# Patient Record
Sex: Female | Born: 1995
Health system: Southern US, Community
[De-identification: ages and names within clinical notes are randomized; demographics above are authoritative.]

## PROBLEM LIST (undated history)

## (undated) DIAGNOSIS — R55 Syncope and collapse: Secondary | ICD-10-CM

## (undated) DIAGNOSIS — J45909 Unspecified asthma, uncomplicated: Secondary | ICD-10-CM

## (undated) DIAGNOSIS — F419 Anxiety disorder, unspecified: Secondary | ICD-10-CM

---
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
## 2014-02-23 ENCOUNTER — Observation Stay: Payer: Self-pay | Admitting: Obstetrics and Gynecology

## 2014-02-28 ENCOUNTER — Inpatient Hospital Stay: Payer: Self-pay | Admitting: Obstetrics & Gynecology

## 2014-02-28 LAB — CBC WITH DIFFERENTIAL/PLATELET
BASOS ABS: 0.1 10*3/uL (ref 0.0–0.1)
BASOS PCT: 0.7 %
EOS ABS: 0.2 10*3/uL (ref 0.0–0.7)
EOS PCT: 1.5 %
HCT: 34 % — ABNORMAL LOW (ref 35.0–47.0)
HGB: 11.1 g/dL — ABNORMAL LOW (ref 12.0–16.0)
LYMPHS PCT: 21.6 %
Lymphocyte #: 3.4 10*3/uL (ref 1.0–3.6)
MCH: 26.6 pg (ref 26.0–34.0)
MCHC: 32.8 g/dL (ref 32.0–36.0)
MCV: 81 fL (ref 80–100)
MONO ABS: 1.5 x10 3/mm — AB (ref 0.2–0.9)
Monocyte %: 9.5 %
NEUTROS ABS: 10.6 10*3/uL — AB (ref 1.4–6.5)
Neutrophil %: 66.7 %
Platelet: 251 10*3/uL (ref 150–440)
RBC: 4.17 10*6/uL (ref 3.80–5.20)
RDW: 14.1 % (ref 11.5–14.5)
WBC: 15.8 10*3/uL — AB (ref 3.6–11.0)

## 2014-02-28 LAB — DRUG SCREEN, URINE

## 2014-02-28 LAB — GLUCOSE, RANDOM: Glucose: 111 mg/dL — ABNORMAL HIGH (ref 65–99)

## 2014-03-01 LAB — GC/CHLAMYDIA PROBE AMP

## 2014-03-03 LAB — HEMATOCRIT: HCT: 29.5 % — AB (ref 35.0–47.0)

## 2014-06-19 ENCOUNTER — Ambulatory Visit: Payer: Self-pay | Admitting: Family Medicine

## 2014-11-09 DIAGNOSIS — F3342 Major depressive disorder, recurrent, in full remission: Secondary | ICD-10-CM | POA: Insufficient documentation

## 2014-12-23 NOTE — Op Note (Signed)
PATIENT NAME:  Susan Swanson, Susan Swanson MR#:  [id number] DATE OF BIRTH:  03-03-1996  DATE OF PROCEDURE:  03/02/2014  PREOPERATIVE DIAGNOSIS: Fetal distress, unresponsive to conservative management remote from delivery.   POSTOPERATIVE DIAGNOSES: Fetal distress, unresponsive to conservative management remote from delivery. Extremely short cord.   PROCEDURE PERFORMED:  Stat LUT C-section.   ESTIMATED BLOOD LOSS: 1000 mL.   SURGEON: Elliot Gurneyarrie C. Evone Arseneau, M.D.   FINDINGS: Aforementioned with term liveborn female infant weighing 8 pounds 5 ounces with Apgars of 8 and 9. Cord gases 7.12/74/24.1/-6.7.   DESCRIPTION OF PROCEDURE: I was called to see the patient at the bedside for heart beat in the 70s.  Maneuvers head, chest, Trendelenburg and elevation of the fetal head were performed. No cord was found. The  patient was found to have decelerations every time the fetus descended into the pelvis.  Given that the patient was only 4 cm at the time the decision was made to go back to the OR as the fetus had dipped to the 70s and had been down for 13 minutes. The patient was rushed back to the operating room for a stat C-section and the fetal heart was noted to have been elevated for approximately 3 minutes when stat section was performed.   Timeout was performed. Stat C-section was performed with a Pfannenstiel skin incision carried sharply down to the fascia. The fascia was then cut all the way across with the knife. The muscle belly midline was identified and spread open with the surgeon's fingers and cut to release the attachments with the Metzenbaum scissors. The peritoneum was grasped and sharply entered. Bladder blade was placed, a uterine incision was made and the infant's head was delivered with extension of the fascial incision with the scissors. The infant was delivered. Cord was milked back into the fetus. Cord was clamped and cut and the fetus took a breath and began to cry.   Attention was then turned to  the mother.  Pitocin was started. The placenta was delivered and the very, very short cord was noted when attempting to get the cord gas. The placenta was delivered. The uterus was exteriorized and wrapped in a moist laparotomy sponge. The incision was closed with a running locked chromic and a running imbricating nonlocking chromic; 3-0 chromic was used to tack back the bladder as it had come off the lower uterine segment. Good hemostasis was identified. The belly was cleared of clots. The uterus was placed back into the abdomen. The gutters were cleared.   The muscle bellies were approximated with Vicryl suture. The On-Q trocar pump was placed. The catheters were wrapped around the muscle belly. The fascia was closed with a running Vicryl suture.  Plain gut suture was used to approximate the subcutaneous fat. Skin clips were placed. Dermabond was placed at the On-Q trocars. A tray was called for and the belly was cleared of all instruments, laps and sponges.  Clear urine was noted in the Foley bag.  Then 4 x 4s and Tegaderms were placed on the trocar site, 4 x 4s and ABDs were placed on the lower incision. The patient's uterus was found to be firm and the patient was taken to recovery after having tolerated the procedure well.    ____________________________ Elliot Gurneyarrie C. Dezmond Downie, MD cck:lt D: 03/02/2014 01:20:33 ET T: 03/02/2014 10:57:27 ET JOB#: [id number]  cc: Elliot Gurneyarrie C. Aviv Lengacher, MD, <Dictator> Elliot GurneyARRIE C Kalifa Cadden MD ELECTRONICALLY SIGNED 03/07/2014 16:05

## 2015-01-09 NOTE — H&P (Signed)
L&D Evaluation:  History Expanded:  HPI 19 yo G1 at 29 weeks w suprapubic pain today.  Had sopme spotting for 2 days. Denies trauma, sex, other.  [facility].  h/o chlamydia and smioking and drug use this pregnancy.   Patient's Medical History No Chronic Illness   Patient's Surgical History none   Medications Pre Natal Vitamins   Allergies Tramadol   Social History none   Family History Non-Contributory   ROS:  ROS All systems were reviewed.  HEENT, CNS, GI, GU, Respiratory, CV, Renal and Musculoskeletal systems were found to be normal.   Exam:  Vital Signs stable   General no apparent distress   Mental Status clear   Abdomen gravid, non-tender   Estimated Fetal Weight Average for gestational age   Back no CVAT   Edema no edema   Pelvic no external lesions, cervix closed and thick   Mebranes Intact   FHT normal rate with no decels   Ucx absent   Skin dry   Impression:  Impression UTI vs vaginitis vs Preterm Labor   Plan:  Plan UA, EFM/NST, monitor contractions and for cervical change   Comments fetal fibronectin GC/Chl   Electronic Signatures: Letitia LibraHarris, Terrica Duecker Paul (MD)  (Signed 14-Apr-15 23:58)  Authored: L&D Evaluation   Last Updated: 14-Apr-15 23:58 by Letitia LibraHarris, Aron Needles Paul (MD)

## 2015-01-09 NOTE — H&P (Signed)
L&D Evaluation:  History:  HPI 19 yo G1 at 40 weeks 5 days gestation by 6042w6d US derived EDC of 02/23/2014 presenting for IOL.  +FM, no LOF, no ctx  [facility].  h/o chlamydia and 1/2 ppd smoking and drug use this pregnancy. Bpos/RI/VNI has  asthma, PF has been good. her gbs is unavailable at this time, tdap in may 2015 at [facility]   Presents with contractions   Patient's Medical History Asthma   Patient's Surgical History none   Medications Pre Natal Vitamins   Allergies Tramadol   Social History none   Family History Non-Contributory   ROS:  ROS All systems were reviewed.  HEENT, CNS, GI, GU, Respiratory, CV, Renal and Musculoskeletal systems were found to be normal.   Exam:  Vital Signs stable  113/62   Urine Protein not completed   General no apparent distress   Mental Status clear   Chest clear   Abdomen gravid, non-tender   Estimated Fetal Weight Average for gestational age   Back no CVAT   Edema no edema   Pelvic no external lesions, 1/75/-3   Mebranes Intact   FHT normal rate with no decels, 130, moderate, postive accels, no decels - cat 1   Ucx irregular   Skin dry   Impression:  Impression IOL 3915w5d postdates   Plan:  Plan EFM/NST   Comments 1)  IOL - cervidil tonight  2) Fetus - category I tracing     - 63lbs weight gain this pregnancy     - AFI today 15.48cm     - early 1-hr was 61 lapse of care between 20-28 weeks was supposed to have 1-hr drawn on 4/7 no results in system     - no growth scan in the 3rd trimester  3) B pos / ABSC neg / RI / VZNI / HBsAg neg / HIV neg / RPR NR / + chlamydia with negative test of cure / early 1-hr 61 GBS (As well as repeat GC & CT) negative on 01/30/14     - varivax on discharge     - GC & CT cultures on admission  4) Asthma  5) TDAP received [facility] 12/2013  6) Substance use - history of cocaine and canabis use will obtain UDS with admission  7)  Contraception - nexplanon  7) Disposition pending delivery   Follow Up Appointment need to schedule   Electronic Signatures: Lorrene ReidStaebler, Andreas M (MD)  (Signed 30-Jun-15 22:19)  Authored: L&D Evaluation   Last Updated: 30-Jun-15 22:19 by Lorrene ReidStaebler, Andreas M (MD)

## 2015-01-09 NOTE — H&P (Signed)
L&D Evaluation:  History Expanded:  HPI 19 yo G1 at 40 weeks with lots of pain today. she hasnt counted how long or how many ctx she has,  Had some spotting for a few days. Denies trauma, sex, other.  [facility].  h/o chlamydia and 1/2 ppd smoking and drug use this pregnancy. Bpos/RI/VNI has  asthma, PF has been good. her gbs is unavailable at this time, tdap in may 2015 at [facility]   Gravida 1   Term 0   PreTerm 0   Abortion 0   Living 0   Maternal HIV Negative   Maternal Syphilis Ab Nonreactive   Maternal Varicella Non-Immune   Rubella Results (Maternal) immune   Maternal T-Dap Immune   [facility]   Presents with contractions   Patient's Medical History No Chronic Illness   Patient's Surgical History none   Medications Pre Natal Vitamins   Allergies Tramadol   Social History none   Family History Non-Contributory   Current Prenatal Course Notable For asthma   ROS:  ROS All systems were reviewed.  HEENT, CNS, GI, GU, Respiratory, CV, Renal and Musculoskeletal systems were found to be normal.   Exam:  Vital Signs stable   Urine Protein not completed   General no apparent distress   Mental Status clear   Chest clear   Abdomen gravid, non-tender   Estimated Fetal Weight Average for gestational age   Back no CVAT   Edema no edema   Pelvic no external lesions, 1-2/50   Mebranes Intact   FHT normal rate with no decels, cat 1   Fetal Heart Rate 140   Ucx irregular   Skin dry   Impression:  Impression early labor   Plan:  Plan EFM/NST, monitor contractions and for cervical change   Follow Up Appointment need to schedule   Electronic Signatures: Adria DevonKlett, Carrie (MD)  (Signed 25-Jun-15 18:44)  Authored: L&D Evaluation   Last Updated: 25-Jun-15 18:44 by Adria DevonKlett, Carrie (MD)

## 2015-06-26 ENCOUNTER — Other Ambulatory Visit: Payer: Self-pay

## 2015-06-26 ENCOUNTER — Emergency Department: Payer: Self-pay

## 2015-06-26 ENCOUNTER — Emergency Department
Admission: EM | Admit: 2015-06-26 | Discharge: 2015-06-26 | Disposition: A | Discharge status: Home / Self Care | Payer: Self-pay | Attending: Student | Admitting: Student

## 2015-06-26 DIAGNOSIS — Z79899 Other long term (current) drug therapy: Secondary | ICD-10-CM | POA: Insufficient documentation

## 2015-06-26 DIAGNOSIS — F329 Major depressive disorder, single episode, unspecified: Secondary | ICD-10-CM | POA: Insufficient documentation

## 2015-06-26 DIAGNOSIS — Z3202 Encounter for pregnancy test, result negative: Secondary | ICD-10-CM | POA: Insufficient documentation

## 2015-06-26 DIAGNOSIS — R55 Syncope and collapse: Secondary | ICD-10-CM | POA: Insufficient documentation

## 2015-06-26 HISTORY — DX: Syncope and collapse: R55

## 2015-06-26 HISTORY — DX: Anxiety disorder, unspecified: F41.9

## 2015-06-26 HISTORY — DX: Unspecified asthma, uncomplicated: J45.909

## 2015-06-26 LAB — CBC WITH DIFFERENTIAL/PLATELET
Basophils Absolute: 0 10*3/uL (ref 0–0.1)
Basophils Relative: 1 %
EOS PCT: 1 %
Eosinophils Absolute: 0.1 10*3/uL (ref 0–0.7)
HCT: 43.6 % (ref 35.0–47.0)
HEMOGLOBIN: 14.5 g/dL (ref 12.0–16.0)
LYMPHS ABS: 2.1 10*3/uL (ref 1.0–3.6)
LYMPHS PCT: 22 %
MCH: 27.2 pg (ref 26.0–34.0)
MCHC: 33.2 g/dL (ref 32.0–36.0)
MCV: 81.8 fL (ref 80.0–100.0)
MONOS PCT: 7 %
Monocytes Absolute: 0.7 10*3/uL (ref 0.2–0.9)
NEUTROS PCT: 69 %
Neutro Abs: 6.6 10*3/uL — ABNORMAL HIGH (ref 1.4–6.5)
Platelets: 230 10*3/uL (ref 150–440)
RBC: 5.33 MIL/uL — AB (ref 3.80–5.20)
RDW: 14.2 % (ref 11.5–14.5)
WBC: 9.5 10*3/uL (ref 3.6–11.0)

## 2015-06-26 LAB — TROPONIN I: Troponin I: <0.03 ng/mL (ref <0.031)

## 2015-06-26 LAB — BASIC METABOLIC PANEL
Anion gap: 11 (ref 5–15)
BUN: 6 mg/dL (ref 6–20)
CHLORIDE: 104 mmol/L (ref 101–111)
CO2: 25 mmol/L (ref 22–32)
Calcium: 9.4 mg/dL (ref 8.9–10.3)
Creatinine, Ser: 0.88 mg/dL (ref 0.44–1.00)
GFR calc Af Amer: >60 mL/min (ref >60)
GFR calc non Af Amer: >60 mL/min (ref >60)
GLUCOSE: 96 mg/dL (ref 65–99)
POTASSIUM: 3.8 mmol/L (ref 3.5–5.1)
Sodium: 140 mmol/L (ref 135–145)

## 2015-06-26 LAB — PREGNANCY, URINE: Preg Test, Ur: NEGATIVE

## 2015-06-26 MED ORDER — SODIUM CHLORIDE 0.9 % IV BOLUS (SEPSIS)
1000.0000 mL | Freq: Once | INTRAVENOUS | Status: AC
  Administered 2015-06-26: 1000 mL via INTRAVENOUS

## 2015-06-26 NOTE — ED Notes (Signed)
MD at bedside. 

## 2015-06-26 NOTE — ED Notes (Signed)
Pt presents to ED with syncope at work. States it happened maybe around 7. Is not able to tell this RN if she hit her head or not, but states that her head does not hurt at this moment.

## 2015-06-26 NOTE — ED Notes (Addendum)
Per EMS, pt is taking unprescribed Adderall and marijuana. Pt does not want family to know this and has stated that she wants family to leave the room when the doctor comes in to talk to her.

## 2015-06-26 NOTE — ED Provider Notes (Signed)
[facility] Emergency Department Provider Note  ____________________________________________  Time seen: Approximately 8:09 AM  I have reviewed the triage vital signs and the nursing notes.   HISTORY  Chief Complaint Loss of Consciousness    HPI Susan Swanson is a 19 y.o. female with history of anxiety, depression, multiple prior syncopal episodes previously evaluated by cardiology who presents for evaluation of syncope. She reports that she was at work this morning, working as a Conservation officer, nature. She suddenly felt lightheaded, nauseated as if the room was spinning and she fainted and fell. She reports this has happened to her numerous times in the past. Denies Hitting her head, she denies any pain complaints as a result of the fall. She denies any chest pain or difficulty breathing. She reports she has had 5 episodes of nonbloody nonbilious emesis this morning but no fever, no diarrhea. She has not been able to eat or drink secondary to nausea over the past 5 days because she is "stressed". She reports that she has a "a lot going on.... I just broke up with my boyfriend". She endorses depression but denies any suicidal ideation, homicidal ideation or audiovisual hallucinations. Currently she feels well, she is asymptomatic. There are no modifying factors. She reports that she is taking Adderall that is not prescribed to her as well as smoking marijuana because it helps her anxiety.   Past Medical History  Diagnosis Date  . Syncope   . Anxiety   . Asthma     There are no active problems to display for this patient.   Past Surgical History  Procedure Laterality Date  . Cesarean section      Current Outpatient Rx  Name  Route  Sig  Dispense  Refill  . etonogestrel (NEXPLANON) 68 MG IMPL implant   Intradermal   Inject 68 mg into the skin once.         . gabapentin (NEURONTIN) 300 MG capsule   Oral   Take 300 mg by mouth 6 (six) times daily.            Allergies Review of patient's allergies indicates no known allergies.  No family history on file.  Social History Social History  Substance Use Topics  . Smoking status: None  . Smokeless tobacco: None  . Alcohol Use: None    Review of Systems Constitutional: No fever/chills Eyes: No visual changes. ENT: No sore throat. Cardiovascular: Denies chest pain. Respiratory: Denies shortness of breath. Gastrointestinal: No abdominal pain.  No nausea, no vomiting.  No diarrhea.  No constipation. Genitourinary: Negative for dysuria. Musculoskeletal: Negative for back pain. Skin: Negative for rash. Neurological: Negative for headaches, focal weakness or numbness.  10-point ROS otherwise negative.  ____________________________________________   PHYSICAL EXAM:  VITAL SIGNS: ED Triage Vitals  Enc Vitals Group     BP 06/26/15 0806 112/69 mmHg     Pulse Rate 06/26/15 0806 83     Resp 06/26/15 0806 14     Temp 06/26/15 0806 98 F (36.7 C)     Temp Source 06/26/15 0806 Oral     SpO2 06/26/15 0804 99 %     Weight 06/26/15 0806 250 lb (113.399 kg)     Height 06/26/15 0806  (1.651 m)     Head Cir --      Peak Flow --      Pain Score 06/26/15 0808 0     Pain Loc --      Pain Edu? --  Excl. in GC? --     Constitutional: Alert and oriented. Well appearing and in no acute distress. Eyes: Conjunctivae are normal. PERRL. EOMI. Head: Atraumatic. Nose: No congestion/rhinnorhea. Mouth/Throat: Mucous membranes are moist.  Oropharynx non-erythematous. Neck: No stridor. No midline C-spine tenderness to palpation. Cardiovascular: Normal rate, regular rhythm. Grossly normal heart sounds.  Good peripheral circulation. Respiratory: Normal respiratory effort.  No retractions. Lungs CTAB. Gastrointestinal: Soft and nontender. No distention. No CVA tenderness. Genitourinary: deferred Musculoskeletal: No lower extremity tenderness nor edema.  No joint effusions. Neurologic:   Normal speech and language. No gross focal neurologic deficits are appreciated. No gait instability. Skin:  Skin is warm, dry and intact. No rash noted. Psychiatric: Mood and affect are normal. Speech and behavior are normal.  ____________________________________________   LABS (all labs ordered are listed, but only abnormal results are displayed)  Labs Reviewed  PREGNANCY, URINE  CBC WITH DIFFERENTIAL/PLATELET  BASIC METABOLIC PANEL  TROPONIN I  URINE DRUG SCREEN, QUALITATIVE ([facility] ONLY)   ____________________________________________  EKG  ED ECG REPORT I, Gayla DossGayle, Aislynn Cifelli A, the attending physician, personally viewed and interpreted this ECG.   Date: 06/26/2015  EKG Time: 08:26  Rate: 85  Rhythm:normal sinus rhythm with sinus arrhythmia, normal EKG  Axis: normal  Intervals:none  ST&T Change: none  ____________________________________________  RADIOLOGY  CXR IMPRESSION: No active disease. ____________________________________________   PROCEDURES  Procedure(s) performed: None  Critical Care performed: No  ____________________________________________   INITIAL IMPRESSION / ASSESSMENT AND PLAN / ED COURSE  Pertinent labs & imaging results that were available during my care of the patient were reviewed by me and considered in my medical decision making (see chart for details).  Susan Swanson is a 19 y.o. female with history of anxiety, depression, multiple prior syncopal episodes previously evaluated by cardiology who presents for evaluation of syncope. On exam, she is very well-appearing and in no acute distress. Vital signs stable, she is afebrile. She is currently asymptomatic. She has benign/atraumatic  examination as well as an intact neurological examination. I suspect likely vasovagal syncope, possibly secondary to dehydration in setting of vomiting today as well as poor oral intake over the past several days. She does have positive orthostatics. She has no  abdominal tenderness, no fevers, I doubt any acute life-threatening intra-abdominal process. Doubt cardiogenic syncope given no chest pain, no difficulty breathing, reassuring EKG and history of prior syncopal events without any diagnosed arrhythmia. Not consistent with acute neurogenic syncope. Went for screening labs, brief observation in the emergency department. We'll give IV fluids. If she can taste to be well appearing, anticipate discharge with close PCP if follow-up.  ----------------------------------------- 10:17 AM on 06/26/2015 ----------------------------------------- Labs reviewed. Normal CBC, BMP, negative troponin, negative pregnancy test. Chest x-ray clear. At this time, the patient reports that she feels well and she is requesting discharge. She is in her room attempting to remove the cardiac leads on her chest, refusing any more IV fluids. Her vital signs are stable, she is suitable for discharge. We discussed need for oral hydration, close PCP follow-up, return precautions, and she is comfortable with the discharge plan.  ____________________________________________   FINAL CLINICAL IMPRESSION(S) / ED DIAGNOSES  Final diagnoses:  Syncope, unspecified syncope type      Gayla DossEryka A Remmy Crass, MD 06/26/15 1018

## 2016-01-15 ENCOUNTER — Emergency Department: Payer: Self-pay

## 2016-01-15 ENCOUNTER — Encounter: Payer: Self-pay | Admitting: Emergency Medicine

## 2016-01-15 ENCOUNTER — Emergency Department
Admission: EM | Admit: 2016-01-15 | Discharge: 2016-01-15 | Disposition: A | Discharge status: Home / Self Care | Payer: Self-pay | Attending: Student | Admitting: Student

## 2016-01-15 DIAGNOSIS — M549 Dorsalgia, unspecified: Secondary | ICD-10-CM | POA: Insufficient documentation

## 2016-01-15 DIAGNOSIS — R55 Syncope and collapse: Secondary | ICD-10-CM | POA: Insufficient documentation

## 2016-01-15 DIAGNOSIS — J45909 Unspecified asthma, uncomplicated: Secondary | ICD-10-CM | POA: Insufficient documentation

## 2016-01-15 DIAGNOSIS — F329 Major depressive disorder, single episode, unspecified: Secondary | ICD-10-CM | POA: Insufficient documentation

## 2016-01-15 DIAGNOSIS — Z79899 Other long term (current) drug therapy: Secondary | ICD-10-CM | POA: Insufficient documentation

## 2016-01-15 DIAGNOSIS — F17210 Nicotine dependence, cigarettes, uncomplicated: Secondary | ICD-10-CM | POA: Insufficient documentation

## 2016-01-15 LAB — URINALYSIS COMPLETE WITH MICROSCOPIC (ARMC ONLY)
Bilirubin Urine: NEGATIVE
Glucose, UA: NEGATIVE mg/dL
KETONES UR: NEGATIVE mg/dL
LEUKOCYTES UA: NEGATIVE
Nitrite: NEGATIVE
PH: 7 (ref 5.0–8.0)
PROTEIN: NEGATIVE mg/dL
RBC / HPF: NONE SEEN RBC/hpf (ref 0–5)
SPECIFIC GRAVITY, URINE: 1.001 — AB (ref 1.005–1.030)
WBC UA: NONE SEEN WBC/hpf (ref 0–5)

## 2016-01-15 LAB — BASIC METABOLIC PANEL
ANION GAP: 7 (ref 5–15)
BUN: 9 mg/dL (ref 6–20)
CHLORIDE: 104 mmol/L (ref 101–111)
CO2: 25 mmol/L (ref 22–32)
CREATININE: 0.74 mg/dL (ref 0.44–1.00)
Calcium: 8.9 mg/dL (ref 8.9–10.3)
GFR calc non Af Amer: >60 mL/min (ref >60)
Glucose, Bld: 91 mg/dL (ref 65–99)
Potassium: 3 mmol/L — ABNORMAL LOW (ref 3.5–5.1)
SODIUM: 136 mmol/L (ref 135–145)

## 2016-01-15 LAB — CBC WITH DIFFERENTIAL/PLATELET
BASOS ABS: 0.1 10*3/uL (ref 0–0.1)
EOS ABS: 0.3 10*3/uL (ref 0–0.7)
Eosinophils Relative: 2 %
HCT: 42.6 % (ref 35.0–47.0)
HEMOGLOBIN: 14.2 g/dL (ref 12.0–16.0)
LYMPHS ABS: 3 10*3/uL (ref 1.0–3.6)
MCH: 27.7 pg (ref 26.0–34.0)
MCHC: 33.2 g/dL (ref 32.0–36.0)
MCV: 83.4 fL (ref 80.0–100.0)
Monocytes Absolute: 0.7 10*3/uL (ref 0.2–0.9)
Monocytes Relative: 5 %
Neutro Abs: 8.4 10*3/uL — ABNORMAL HIGH (ref 1.4–6.5)
Neutrophils Relative %: 68 %
Platelets: 221 10*3/uL (ref 150–440)
RBC: 5.11 MIL/uL (ref 3.80–5.20)
RDW: 13.7 % (ref 11.5–14.5)
WBC: 12.4 10*3/uL — AB (ref 3.6–11.0)

## 2016-01-15 LAB — FIBRIN DERIVATIVES D-DIMER (ARMC ONLY): FIBRIN DERIVATIVES D-DIMER (ARMC): 409 (ref 0–499)

## 2016-01-15 LAB — TROPONIN I

## 2016-01-15 LAB — POCT PREGNANCY, URINE: PREG TEST UR: NEGATIVE

## 2016-01-15 MED ORDER — SODIUM CHLORIDE 0.9 % IV BOLUS (SEPSIS)
1000.0000 mL | Freq: Once | INTRAVENOUS | Status: AC
  Administered 2016-01-15: 1000 mL via INTRAVENOUS

## 2016-01-15 MED ORDER — KETOROLAC TROMETHAMINE 30 MG/ML IJ SOLN
15.0000 mg | Freq: Once | INTRAMUSCULAR | Status: AC
  Administered 2016-01-15: 15 mg via INTRAVENOUS
  Filled 2016-01-15: qty 1

## 2016-01-15 MED ORDER — POTASSIUM CHLORIDE CRYS ER 20 MEQ PO TBCR
40.0000 meq | EXTENDED_RELEASE_TABLET | Freq: Once | ORAL | Status: AC
  Administered 2016-01-15: 40 meq via ORAL
  Filled 2016-01-15: qty 2

## 2016-01-15 MED ORDER — IBUPROFEN 600 MG PO TABS: 600.0000 mg | ORAL_TABLET | Freq: Four times a day (QID) | ORAL | Status: DC | PRN

## 2016-01-15 NOTE — ED Notes (Signed)
Pt here with c/o right flank pain and lower abd pain that "feels like I am in labor," had a pre-syncopal episode at work today, was able to sit down and never had a complete syncopal episode. Drove herself to the ED, when speaking with registration, pt had a syncopal episode. Arrived via stretcher with collar intact. C/o pain in the side of her neck, states she has waves of right flank pain and lower abd pain. Appears in NAD. VSS.

## 2016-01-15 NOTE — ED Provider Notes (Addendum)
[facility] Emergency Department Provider Note   ____________________________________________  Time seen: Approximately 3:45 PM  I have reviewed the triage vital signs and the nursing notes.   HISTORY  Chief Complaint Loss of Consciousness; Back Pain; and Flank Pain    HPI Susan Swanson is a 20 y.o. female  with history of anxiety, depression, multiple prior syncopal episodes previously evaluated by cardiology who presents for evaluation of syncope and back pain today, gradual onset, moderate, syncope preceded by a/exacerbated by pain. Patient reports that while she is at work she began having back spasms in her back bilaterally. She began feeling lightheaded as if she was stable but she was able to lower herself down to the ground. When she arrived to the emergency department, she was being evaluated in triage and had another bout of severe back pain at which time she did faint, she hit her head but she denies any severe headache or neck pain. She has had multiple prior episodes of syncope, stating at minimum 20-30 episodes, with previously unrevealing workup. She reports this is similar to her prior syncopal episodes. She denies any back trauma prior to today, no bowel or bladder incontinence, no history of malignancy, no history of IV drug use, no fevers, no saddle anesthesia. She reports she has been having some mild chest pain over the past couple of days but she believes that that may be secondary to the fact that she is "getting a cold", she has had cough and runny nose. She denies any personal history of PE or DVT but does report that she has a strong family history of "blood clots".   Past Medical History  Diagnosis Date  . Syncope   . Anxiety   . Asthma     There are no active problems to display for this patient.   Past Surgical History  Procedure Laterality Date  . Cesarean section      Current Outpatient Rx  Name  Route  Sig  Dispense   Refill  . etonogestrel (NEXPLANON) 68 MG IMPL implant   Intradermal   Inject 68 mg into the skin once.         . gabapentin (NEURONTIN) 300 MG capsule   Oral   Take 300 mg by mouth 6 (six) times daily.         Marland Kitchen. ibuprofen (ADVIL,MOTRIN) 600 MG tablet   Oral   Take 1 tablet (600 mg total) by mouth every 6 (six) hours as needed for moderate pain.   15 tablet   0     Allergies Review of patient's allergies indicates no known allergies.  No family history on file.  Social History Social History  Substance Use Topics  . Smoking status: Current Every Day Smoker -- 0.50 packs/day    Types: Cigarettes  . Smokeless tobacco: None  . Alcohol Use: None    Review of Systems Constitutional: No fever/chills Eyes: No visual changes. ENT: No sore throat. Cardiovascular: Denies chest pain. Respiratory: Denies shortness of breath. Gastrointestinal: No abdominal pain.  No nausea, no vomiting.  No diarrhea.  No constipation. Genitourinary: Negative for dysuria. Musculoskeletal: Positive for back pain. Skin: Negative for rash. Neurological: Negative for headaches, focal weakness or numbness.  10-point ROS otherwise negative.  ____________________________________________   PHYSICAL EXAM:   Filed Vitals:   01/15/16 1644 01/15/16 1648 01/15/16 1730  BP: 113/67  108/55  Pulse: 87  70  Temp: 98.2 F (36.8 C)    TempSrc: Oral  Resp: 18  16  Height:  5\' 4"  (1.626 m)   Weight:  238 lb 1.6 oz (108 kg)   SpO2: 100%  98%    Constitutional: Alert and oriented. Nontoxic- appearing and in no acute distress. Eyes: Conjunctivae are normal. PERRL. EOMI. Head: Atraumatic. Nose: No congestion/rhinnorhea. Mouth/Throat: Mucous membranes are moist.  Oropharynx non-erythematous. Neck: No stridor. Supple without meningismus. No midline C-spine tenderness to palpation, no bony step-off or deformity. Cardiovascular: Normal rate, regular rhythm. Grossly normal heart sounds.  Good  peripheral circulation. Respiratory: Normal respiratory effort.  No retractions. Lungs CTAB. Gastrointestinal: Soft and nontender. No distention. No CVA tenderness. Genitourinary: deferred Musculoskeletal: No lower extremity tenderness nor edema.  No joint effusions. Mild midline tender to palpation throughout the midline of the lumbar spine as well as in the paravertebral muscles of the lumbar spine bilaterally. Neurologic:  Normal speech and language. No gross focal neurologic deficits are appreciated. 5 out of 5 strength bilateral upper and lower extremities, sensation intact to light touch throughout. Normal strength of dorsiflexion and bilateral hallux toes. Normal ambulation. Skin:  Skin is warm, dry and intact. No rash noted. Psychiatric: Mood and affect are normal. Speech and behavior are normal.  ____________________________________________   LABS (all labs ordered are listed, but only abnormal results are displayed)  Labs Reviewed  CBC WITH DIFFERENTIAL/PLATELET - Abnormal; Notable for the following:    WBC 12.4 (*)    Neutro Abs 8.4 (*)    All other components within normal limits  BASIC METABOLIC PANEL - Abnormal; Notable for the following:    Potassium 3.0 (*)    All other components within normal limits  URINALYSIS COMPLETEWITH MICROSCOPIC ([facility] ONLY) - Abnormal; Notable for the following:    Color, Urine STRAW (*)    APPearance HAZY (*)    Specific Gravity, Urine 1.001 (*)    Hgb urine dipstick 1+ (*)    Bacteria, UA FEW (*)    Squamous Epithelial / LPF 0-5 (*)    All other components within normal limits  TROPONIN I  FIBRIN DERIVATIVES D-DIMER ([facility] ONLY)  POC URINE PREG, ED  POCT PREGNANCY, URINE   ____________________________________________  EKG  ED ECG REPORT I, Gayla Doss, the attending physician, personally viewed and interpreted this ECG.   Date: 01/15/2016  EKG Time: 15:54  Rate: 79  Rhythm: normal EKG, normal sinus rhythm  Axis: normal   Intervals:none  ST&T Change: No acute ST elevation.  ____________________________________________  RADIOLOGY  CXR IMPRESSION: No active cardiopulmonary disease.   Xray lumbar spine IMPRESSION: No acute abnormality noted. ____________________________________________   PROCEDURES  Procedure(s) performed: None  Critical Care performed: No  ____________________________________________   INITIAL IMPRESSION / ASSESSMENT AND PLAN / ED COURSE  Pertinent labs & imaging results that were available during my care of the patient were reviewed by me and considered in my medical decision making (see chart for details).  Susan Swanson is a 19 y.o. female  with history of anxiety, depression, multiple prior syncopal episodes previously evaluated by cardiology who presents for evaluation of syncope and back pain today. On exam, she is nontoxic appearing and in no acute distress. Head and neck are atraumatic. She does have some midline tenderness in the lumbar paravertebral muscles of the back as well as the midline. She has an intact neurological examination. Recurrent syncope, likely orthostatic/vagally mediated in the setting of pain. EKG reassuring, intact neurological exam, doubt purely cardiogenic or neurogenic cause of syncope. Additionally, she appears to be having  what sound like back spasms, no red flags concerning for cauda equina or epidural abscess. We'll treat her pain, obtain screening labs, chest x-ray, we'll also send d-dimer given her vague complaint of chest pain, syncope, family history. Reassess for disposition.  ----------------------------------------- 5:57 PM on 01/15/2016 -----------------------------------------  Patient reports significant symptomatic improvement at this time she sitting up in bed, drinking from a cup of water. While reviewed and are notable for a mild leukocytosis however the patient often has leukocytosis or her your visit. BMP was notable for  mild hypokalemia, I did give her 40 mEq of potassium in the emergency department and encouraged her to follow up with her primary care doctor. Negative troponin, d-dimer was not elevated, doubt PE. Urinalysis not consistent with infection and the test is negative. Plain films show no acute traumatic pathology. Discussed return precautions, need for close PCP follow-up and the patient is, comfortable with the discharge plan. DC home. ____________________________________________   FINAL CLINICAL IMPRESSION(S) / ED DIAGNOSES  Final diagnoses:  Syncope, unspecified syncope type  Bilateral back pain, unspecified location      NEW MEDICATIONS STARTED DURING THIS VISIT:  New Prescriptions   IBUPROFEN (ADVIL,MOTRIN) 600 MG TABLET    Take 1 tablet (600 mg total) by mouth every 6 (six) hours as needed for moderate pain.     Note:  This document was prepared using Dragon voice recognition software and may include unintentional dictation errors.    Gayla Doss, MD 01/15/16 1759  Gayla Doss, MD 01/15/16 4098

## 2016-01-15 NOTE — ED Notes (Signed)
Pt ambulatory getting up and walking around room and getting herself dressed with no assistance.

## 2016-02-19 ENCOUNTER — Emergency Department: Payer: Self-pay

## 2016-02-19 ENCOUNTER — Emergency Department
Admission: EM | Admit: 2016-02-19 | Discharge: 2016-02-20 | Disposition: A | Discharge status: Home / Self Care | Payer: Self-pay | Attending: Emergency Medicine | Admitting: Emergency Medicine

## 2016-02-19 ENCOUNTER — Encounter: Payer: Self-pay | Admitting: Emergency Medicine

## 2016-02-19 DIAGNOSIS — Y999 Unspecified external cause status: Secondary | ICD-10-CM | POA: Insufficient documentation

## 2016-02-19 DIAGNOSIS — S93601A Unspecified sprain of right foot, initial encounter: Secondary | ICD-10-CM

## 2016-02-19 DIAGNOSIS — W109XXA Fall (on) (from) unspecified stairs and steps, initial encounter: Secondary | ICD-10-CM | POA: Insufficient documentation

## 2016-02-19 DIAGNOSIS — Y939 Activity, unspecified: Secondary | ICD-10-CM | POA: Insufficient documentation

## 2016-02-19 DIAGNOSIS — S39012S Strain of muscle, fascia and tendon of lower back, sequela: Secondary | ICD-10-CM | POA: Insufficient documentation

## 2016-02-19 DIAGNOSIS — W19XXXA Unspecified fall, initial encounter: Secondary | ICD-10-CM

## 2016-02-19 DIAGNOSIS — S93401A Sprain of unspecified ligament of right ankle, initial encounter: Secondary | ICD-10-CM | POA: Insufficient documentation

## 2016-02-19 DIAGNOSIS — F17210 Nicotine dependence, cigarettes, uncomplicated: Secondary | ICD-10-CM | POA: Insufficient documentation

## 2016-02-19 DIAGNOSIS — J45909 Unspecified asthma, uncomplicated: Secondary | ICD-10-CM | POA: Insufficient documentation

## 2016-02-19 DIAGNOSIS — Y929 Unspecified place or not applicable: Secondary | ICD-10-CM | POA: Insufficient documentation

## 2016-02-19 LAB — POCT PREGNANCY, URINE: PREG TEST UR: NEGATIVE

## 2016-02-19 MED ORDER — NAPROXEN 500 MG PO TABS: 500.0000 mg | ORAL_TABLET | Freq: Two times a day (BID) | ORAL | Status: DC

## 2016-02-19 MED ORDER — HYDROCODONE-ACETAMINOPHEN 5-325 MG PO TABS
2.0000 | ORAL_TABLET | Freq: Once | ORAL | Status: AC
  Administered 2016-02-19: 2 via ORAL
  Filled 2016-02-19: qty 2

## 2016-02-19 MED ORDER — HYDROCODONE-ACETAMINOPHEN 5-325 MG PO TABS: 1.0000 | ORAL_TABLET | ORAL | Status: DC | PRN

## 2016-02-19 NOTE — Discharge Instructions (Signed)
Ankle Sprain °An ankle sprain is an injury to the strong, fibrous tissues (ligaments) that hold the bones of your ankle joint together.  °CAUSES °An ankle sprain is usually caused by a fall or by twisting your ankle. Ankle sprains most commonly occur when you step on the outer edge of your foot, and your ankle turns inward. People who participate in sports are more prone to these types of injuries.  °SYMPTOMS  °· Pain in your ankle. The pain may be present at rest or only when you are trying to stand or walk. °· Swelling. °· Bruising. Bruising may develop immediately or within 1 to 2 days after your injury. °· Difficulty standing or walking, particularly when turning corners or changing directions. °DIAGNOSIS  °Your caregiver will ask you details about your injury and perform a physical exam of your ankle to determine if you have an ankle sprain. During the physical exam, your caregiver will press on and apply pressure to specific areas of your foot and ankle. Your caregiver will try to move your ankle in certain ways. An X-ray exam may be done to be sure a bone was not broken or a ligament did not separate from one of the bones in your ankle (avulsion fracture).  °TREATMENT  °Certain types of braces can help stabilize your ankle. Your caregiver can make a recommendation for this. Your caregiver may recommend the use of medicine for pain. If your sprain is severe, your caregiver may refer you to a surgeon who helps to restore function to parts of your skeletal system (orthopedist) or a physical therapist. °HOME CARE INSTRUCTIONS  °· Apply ice to your injury for 1-2 days or as directed by your caregiver. Applying ice helps to reduce inflammation and pain. °· Put ice in a plastic bag. °· Place a towel between your skin and the bag. °· Leave the ice on for 15-20 minutes at a time, every 2 hours while you are awake. °· Only take over-the-counter or prescription medicines for pain, discomfort, or fever as directed by  your caregiver. °· Elevate your injured ankle above the level of your heart as much as possible for 2-3 days. °· If your caregiver recommends crutches, use them as instructed. Gradually put weight on the affected ankle. Continue to use crutches or a cane until you can walk without feeling pain in your ankle. °· If you have a plaster splint, wear the splint as directed by your caregiver. Do not rest it on anything harder than a pillow for the first 24 hours. Do not put weight on it. Do not get it wet. You may take it off to take a shower or bath. °· You may have been given an elastic bandage to wear around your ankle to provide support. If the elastic bandage is too tight (you have numbness or tingling in your foot or your foot becomes cold and blue), adjust the bandage to make it comfortable. °· If you have an air splint, you may blow more air into it or let air out to make it more comfortable. You may take your splint off at night and before taking a shower or bath. Wiggle your toes in the splint several times per day to decrease swelling. °SEEK MEDICAL CARE IF:  °· You have rapidly increasing bruising or swelling. °· Your toes feel extremely cold or you lose feeling in your foot. °· Your pain is not relieved with medicine. °SEEK IMMEDIATE MEDICAL CARE IF: °· Your toes are numb or blue. °·   You have severe pain that is increasing. °MAKE SURE YOU:  °· Understand these instructions. °· Will watch your condition. °· Will get help right away if you are not doing well or get worse. °  °This information is not intended to replace advice given to you by your health care provider. Make sure you discuss any questions you have with your health care provider. °  °Document Released: 08/18/2005 Document Revised: 09/08/2014 Document Reviewed: 08/30/2011 °Elsevier Interactive Patient Education ©2016 [employer] ° °Cryotherapy °Cryotherapy means treatment with cold. Ice or gel packs can be used to reduce both pain and swelling.  Ice is the most helpful within the first 24 to 48 hours after an injury or flare-up from overusing a muscle or joint. Sprains, strains, spasms, burning pain, shooting pain, and aches can all be eased with ice. Ice can also be used when recovering from surgery. Ice is effective, has very few side effects, and is safe for most people to use. °PRECAUTIONS  °Ice is not a safe treatment option for people with: °· Raynaud phenomenon. This is a condition affecting small blood vessels in the extremities. Exposure to cold may cause your problems to return. °· Cold hypersensitivity. There are many forms of cold hypersensitivity, including: °¨ Cold urticaria. Red, itchy hives appear on the skin when the tissues begin to warm after being iced. °¨ Cold erythema. This is a red, itchy rash caused by exposure to cold. °¨ Cold hemoglobinuria. Red blood cells break down when the tissues begin to warm after being iced. The hemoglobin that carry oxygen are passed into the urine because they cannot combine with blood proteins fast enough. °· Numbness or altered sensitivity in the area being iced. °If you have any of the following conditions, do not use ice until you have discussed cryotherapy with your caregiver: °· Heart conditions, such as arrhythmia, angina, or chronic heart disease. °· High blood pressure. °· Healing wounds or open skin in the area being iced. °· Current infections. °· Rheumatoid arthritis. °· Poor circulation. °· Diabetes. °Ice slows the blood flow in the region it is applied. This is beneficial when trying to stop inflamed tissues from spreading irritating chemicals to surrounding tissues. However, if you expose your skin to cold temperatures for too long or without the proper protection, you can damage your skin or nerves. Watch for signs of skin damage due to cold. °HOME CARE INSTRUCTIONS °Follow these tips to use ice and cold packs safely. °· Place a dry or damp towel between the ice and skin. A damp towel will  cool the skin more quickly, so you may need to shorten the time that the ice is used. °· For a more rapid response, add gentle compression to the ice. °· Ice for no more than 10 to 20 minutes at a time. The bonier the area you are icing, the less time it will take to get the benefits of ice. °· Check your skin after 5 minutes to make sure there are no signs of a poor response to cold or skin damage. °· Rest 20 minutes or more between uses. °· Once your skin is numb, you can end your treatment. You can test numbness by very lightly touching your skin. The touch should be so light that you do not see the skin dimple from the pressure of your fingertip. When using ice, most people will feel these normal sensations in this order: cold, burning, aching, and numbness. °· Do not use ice on someone who   cannot communicate their responses to pain, such as small children or people with dementia. °HOW TO MAKE AN ICE PACK °Ice packs are the most common way to use ice therapy. Other methods include ice massage, ice baths, and cryosprays. Muscle creams that cause a cold, tingly feeling do not offer the same benefits that ice offers and should not be used as a substitute unless recommended by your caregiver. °To make an ice pack, do one of the following: °· Place crushed ice or a bag of frozen vegetables in a sealable plastic bag. Squeeze out the excess air. Place this bag inside another plastic bag. Slide the bag into a pillowcase or place a damp towel between your skin and the bag. °· Mix 3 parts water with 1 part rubbing alcohol. Freeze the mixture in a sealable plastic bag. When you remove the mixture from the freezer, it will be slushy. Squeeze out the excess air. Place this bag inside another plastic bag. Slide the bag into a pillowcase or place a damp towel between your skin and the bag. °SEEK MEDICAL CARE IF: °· You develop white spots on your skin. This may give the skin a blotchy (mottled) appearance. °· Your skin turns  blue or pale. °· Your skin becomes waxy or hard. °· Your swelling gets worse. °MAKE SURE YOU:  °· Understand these instructions. °· Will watch your condition. °· Will get help right away if you are not doing well or get worse. °  °This information is not intended to replace advice given to you by your health care provider. Make sure you discuss any questions you have with your health care provider. °  °Document Released: 04/14/2011 Document Revised: 09/08/2014 Document Reviewed: 04/14/2011 °Elsevier Interactive Patient Education ©2016 [employer] ° °Elastic Bandage and RICE °WHAT DOES AN ELASTIC BANDAGE DO? °Elastic bandages come in different shapes and sizes. They generally provide support to your injury and reduce swelling while you are healing, but they can perform different functions. Your health care provider will help you to decide what is best for your protection, recovery, or rehabilitation following an injury. °WHAT ARE SOME GENERAL TIPS FOR USING AN ELASTIC BANDAGE? °· Use the bandage as directed by the maker of the bandage that you are using. °· Do not wrap the bandage too tightly. This may cut off the circulation in the arm or leg in the area below the bandage. °¨ If part of your body beyond the bandage becomes blue, numb, cold, swollen, or is more painful, your bandage is most likely too tight. If this occurs, remove your bandage and reapply it more loosely. °· See your health care provider if the bandage seems to be making your problems worse rather than better. °· An elastic bandage should be removed and reapplied every 3-4 hours or as directed by your health care provider. °WHAT IS RICE? °The routine care of many injuries includes rest, ice, compression, and elevation (RICE therapy).  °Rest °Rest is required to allow your body to heal. Generally, you can resume your routine activities when you are comfortable and have been given permission by your health care provider. °Ice °Icing your injury helps  to keep the swelling down and it reduces pain. Do not apply ice directly to your skin. °· Put ice in a plastic bag. °· Place a towel between your skin and the bag. °· Leave the ice on for 20 minutes, 2-3 times per day. °Do this for as long as you are directed by your health   care provider. °Compression °Compression helps to keep swelling down, gives support, and helps with discomfort. Compression may be done with an elastic bandage. °Elevation °Elevation helps to reduce swelling and it decreases pain. If possible, your injured area should be placed at or above the level of your heart or the center of your chest. °WHEN SHOULD I SEEK MEDICAL CARE? °You should seek medical care if: °· You have persistent pain and swelling. °· Your symptoms are getting worse rather than improving. °These symptoms may indicate that further evaluation or further X-rays are needed. Sometimes, X-rays may not show a small broken bone (fracture) until a number of days later. Make a follow-up appointment with your health care provider. Ask when your X-ray results will be ready. Make sure that you get your X-ray results. °WHEN SHOULD I SEEK IMMEDIATE MEDICAL CARE? °You should seek immediate medical care if: °· You have a sudden onset of severe pain at or below the area of your injury. °· You develop redness or increased swelling around your injury. °· You have tingling or numbness at or below the area of your injury that does not improve after you remove the elastic bandage. °  °This information is not intended to replace advice given to you by your health care provider. Make sure you discuss any questions you have with your health care provider. °  °Document Released: 02/07/2002 Document Revised: 05/09/2015 Document Reviewed: 04/03/2014 °Elsevier Interactive Patient Education ©2016 [employer] ° °

## 2016-02-19 NOTE — ED Notes (Addendum)
Pt presents ED to be evaluated for fall on stairs. Buise/abrasions noted on right foot and lower back. No deformity noted.

## 2016-02-19 NOTE — ED Provider Notes (Signed)
[facility] Emergency Department Provider Note  ____________________________________________  Time seen: Approximately 9:48 PM  I have reviewed the triage vital signs and the nursing notes.   HISTORY  Chief Complaint Fall    HPI Susan Swanson is a 20 y.o. female presents for evaluation of right foot and ankle pain as well as low back pain. Patient states that she fell going down the steps twisting her ankle.   Past Medical History  Diagnosis Date  . Syncope   . Anxiety   . Asthma     There are no active problems to display for this patient.   Past Surgical History  Procedure Laterality Date  . Cesarean section      Current Outpatient Rx  Name  Route  Sig  Dispense  Refill  . etonogestrel (NEXPLANON) 68 MG IMPL implant   Intradermal   Inject 68 mg into the skin once.         . gabapentin (NEURONTIN) 300 MG capsule   Oral   Take 300 mg by mouth 6 (six) times daily.         Marland Kitchen. HYDROcodone-acetaminophen (NORCO) 5-325 MG tablet   Oral   Take 1-2 tablets by mouth every 4 (four) hours as needed for moderate pain.   15 tablet   0   . naproxen (NAPROSYN) 500 MG tablet   Oral   Take 1 tablet (500 mg total) by mouth 2 (two) times daily with a meal.   60 tablet   0     Allergies Review of patient's allergies indicates no known allergies.  History reviewed. No pertinent family history.  Social History Social History  Substance Use Topics  . Smoking status: Current Every Day Smoker -- 0.50 packs/day    Types: Cigarettes  . Smokeless tobacco: None  . Alcohol Use: None    Review of Systems Constitutional: No fever/chills Cardiovascular: Denies chest pain. Respiratory: Denies shortness of breath. Musculoskeletal: Positive for foot and ankle and low back pain. Skin: Negative for rash. Neurological: Negative for headaches, focal weakness or numbness.  10-point ROS otherwise  negative.  ____________________________________________   PHYSICAL EXAM: BP 113/72 mmHg  Pulse 66  Temp(Src) 98.4 F (36.9 C) (Oral)  Resp 18  Ht 5\' 4"  (1.626 m)  Wt 107.956 kg  BMI 40.83 kg/m2  SpO2 100%  LMP   VITAL SIGNS: ED Triage Vitals  Enc Vitals Group     BP --      Pulse --      Resp --      Temp --      Temp src --      SpO2 --      Weight --      Height --      Head Cir --      Peak Flow --      Pain Score --      Pain Loc --      Pain Edu? --      Excl. in GC? --     Constitutional: Alert and oriented. Well appearing and in no acute distress.  Cardiovascular: Normal rate, regular rhythm. Grossly normal heart sounds.  Good peripheral circulation. Respiratory: Normal respiratory effort.  No retractions. Lungs CTAB. Musculoskeletal: Point tenderness with lateral swelling to the right foot and ankle. Limited range of motion distally neurovascularly intact. Superficial bruising and abrasions noted. Neurologic:  Normal speech and language. No gross focal neurologic deficits are appreciated. No gait instability. Skin:  Skin  is warm, dry and intact. No rash noted. Psychiatric: Mood and affect are normal. Speech and behavior are normal.  ____________________________________________   LABS (all labs ordered are listed, but only abnormal results are displayed)  Labs Reviewed  POC URINE PREG, ED  POCT PREGNANCY, URINE   ____________________________________________  EKG   ____________________________________________  RADIOLOGY  No acute osseous findings. ____________________________________________   PROCEDURES  Procedure(s) performed: None  Critical Care performed: No  ____________________________________________   INITIAL IMPRESSION / ASSESSMENT AND PLAN / ED COURSE  Pertinent labs & imaging results that were available during my care of the patient were reviewed by me and considered in my medical decision making (see chart for  details).  Status post fall with acute right ankle sprain and foot contusion. Acute lumbar contusion. Rx given for Naprosyn 500 mg twice a day and hydrocodone 5/325. Patient to use crutches as needed for comfort and follow up with PCP. ____________________________________________   FINAL CLINICAL IMPRESSION(S) / ED DIAGNOSES  Final diagnoses:  Fall, initial encounter  Ankle sprain, right, initial encounter  Foot sprain, right, initial encounter  Lumbar strain, sequela     This chart was dictated using voice recognition software/Dragon. Despite best efforts to proofread, errors can occur which can change the meaning. Any change was purely unintentional.   Evangeline Dakin, PA-C 02/19/16 2346  Rockne Menghini, MD 02/25/16 1151

## 2016-05-12 ENCOUNTER — Emergency Department
Admission: EM | Admit: 2016-05-12 | Discharge: 2016-05-12 | Disposition: A | Discharge status: Home / Self Care | Payer: Medicaid Other | Attending: Student in an Organized Health Care Education/Training Program | Admitting: Student in an Organized Health Care Education/Training Program

## 2016-05-12 DIAGNOSIS — F17210 Nicotine dependence, cigarettes, uncomplicated: Secondary | ICD-10-CM | POA: Insufficient documentation

## 2016-05-12 DIAGNOSIS — T7840XA Allergy, unspecified, initial encounter: Secondary | ICD-10-CM

## 2016-05-12 DIAGNOSIS — J45909 Unspecified asthma, uncomplicated: Secondary | ICD-10-CM | POA: Insufficient documentation

## 2016-05-12 MED ORDER — FAMOTIDINE 20 MG PO TABS
40.0000 mg | ORAL_TABLET | Freq: Once | ORAL | Status: AC
  Administered 2016-05-12: 40 mg via ORAL

## 2016-05-12 MED ORDER — PREDNISONE 20 MG PO TABS
60.0000 mg | ORAL_TABLET | Freq: Once | ORAL | Status: AC
  Administered 2016-05-12: 60 mg via ORAL

## 2016-05-12 MED ORDER — PREDNISONE 20 MG PO TABS
ORAL_TABLET | ORAL | Status: AC
  Administered 2016-05-12: 60 mg via ORAL
  Filled 2016-05-12: qty 3

## 2016-05-12 MED ORDER — PREDNISONE 20 MG PO TABS: 40.0000 mg | ORAL_TABLET | Freq: Every day | ORAL | 0 refills | Status: AC

## 2016-05-12 MED ORDER — EPINEPHRINE 0.3 MG/0.3ML IJ SOAJ: 0.3000 mg | Freq: Once | INTRAMUSCULAR | 0 refills | Status: AC

## 2016-05-12 MED ORDER — FAMOTIDINE 20 MG PO TABS
ORAL_TABLET | ORAL | Status: AC
  Administered 2016-05-12: 40 mg via ORAL
  Filled 2016-05-12: qty 2

## 2016-05-12 NOTE — ED Triage Notes (Signed)
Pt states that she has been having a reaction for the past 4 days, states itching and rash all over body, pt noted to be scratching her hands, denies any hx of reactions in the past, pt states that she thinks she maybe allergic to flea bites, pt states that she took benadryl 2.5 hrs ago and states that usually has been helping, pt states that she feels like her throat is thick, pt states that she is here by herself, pt noted to have areas of red splotches to her chest and hands

## 2016-05-12 NOTE — ED Provider Notes (Signed)
[facility] Emergency Department Provider Note    First MD Initiated Contact with Patient 05/12/16 [phone]     (approximate)  I have reviewed the triage vital signs and the nursing notes.   HISTORY  Chief Complaint Allergic Reaction    HPI Susan Swanson is a 20 y.o. female with history of anxiety and asthma presents with diffuse urticaria and sensation of fluids swelling last night after the patient that she is exposed to fleas at her grandmother's house. Patient presented to the ER for further evaluation. After leaving her grandmother's house that the swelling dissipated. She's been observed in the ear now 4 hours and has no other complaints at this time. No previous history of anaphylaxis. Denies any shortness of breath. No nausea or vomiting. No abdominal pain. Denies any other exposures.   Past Medical History:  Diagnosis Date  . Anxiety   . Asthma   . Syncope     There are no active problems to display for this patient.   Past Surgical History:  Procedure Laterality Date  . CESAREAN SECTION      Prior to Admission medications   Medication Sig Start Date End Date Taking? Authorizing Provider  EPINEPHrine 0.3 mg/0.3 mL IJ SOAJ injection Inject 0.3 mLs (0.3 mg total) into the muscle once. 05/12/16 05/12/16  Willy Eddy, MD  etonogestrel (NEXPLANON) 68 MG IMPL implant Inject 68 mg into the skin once.    Historical Provider, MD  gabapentin (NEURONTIN) 300 MG capsule Take 300 mg by mouth 6 (six) times daily.    Historical Provider, MD  HYDROcodone-acetaminophen (NORCO) 5-325 MG tablet Take 1-2 tablets by mouth every 4 (four) hours as needed for moderate pain. 02/19/16   Evangeline Dakin, PA-C  naproxen (NAPROSYN) 500 MG tablet Take 1 tablet (500 mg total) by mouth 2 (two) times daily with a meal. 02/19/16   Charmayne Sheer Beers, PA-C  predniSONE (DELTASONE) 20 MG tablet Take 2 tablets (40 mg total) by mouth daily. 05/12/16 05/17/16  Willy Eddy, MD     Allergies Review of patient's allergies indicates no known allergies.  FMH: no bleeding disorders  Social History Social History  Substance Use Topics  . Smoking status: Current Every Day Smoker    Packs/day: 0.50    Types: Cigarettes  . Smokeless tobacco: Not on file  . Alcohol use Not on file    Review of Systems Patient denies headaches, rhinorrhea, blurry vision, numbness, shortness of breath, chest pain, edema, cough, abdominal pain, nausea, vomiting, diarrhea, dysuria, fevers, rashes or hallucinations unless otherwise stated above in HPI. ____________________________________________   PHYSICAL EXAM:  VITAL SIGNS: Vitals:   05/12/16 0256 05/12/16 0621  BP: (!) 123/58 126/64  Pulse: (!) 106 76  Resp: 20 18  Temp: 98.2 F (36.8 C)     Constitutional: Alert and oriented. Well appearing and in no acute distress. Eyes: Conjunctivae are normal. PERRL. EOMI. Head: Atraumatic. Nose: No congestion/rhinnorhea. Mouth/Throat: Mucous membranes are moist.  Oropharynx non-erythematous. Neck: No stridor. Painless ROM. No cervical spine tenderness to palpation Hematological/Lymphatic/Immunilogical: No cervical lymphadenopathy. Cardiovascular: Normal rate, regular rhythm. Grossly normal heart sounds.  Good peripheral circulation. Respiratory: Normal respiratory effort.  No retractions. Lungs CTAB. Gastrointestinal: Soft and nontender. No distention. No abdominal bruits. No CVA tenderness.   Musculoskeletal: No lower extremity tenderness nor edema.  No joint effusions. Neurologic:  Normal speech and language. No gross focal neurologic deficits are appreciated. No gait instability. Skin:  Skin is warm, dry and intact. No  rash noted. Psychiatric: Mood and affect are normal. Speech and behavior are normal.  ____________________________________________   LABS (all labs ordered are listed, but only abnormal results are displayed)  No results found for this or any previous  visit (from the past 24 hour(s)). ____________________________________________   ____________________________________________   PROCEDURES  Procedure(s) performed: none    Critical Care performed: no ____________________________________________   INITIAL IMPRESSION / ASSESSMENT AND PLAN / ED COURSE  Pertinent labs & imaging results that were available during my care of the patient were reviewed by me and considered in my medical decision making (see chart for details).  RUE:[id number]:anaphylaxis, anaphylactoid, urticaria, pruritis, insect bites  Susan Swanson is a 20 y.o. who presents to the ED with allergic reaction reportedly incited from fleas. Patient is afebrile and hemodynamically stable. She's been observed in the ER for 4 hours. She did not require any epinephrine. She has no stridor. No uvular edema. No urticaria on exam her abdominal exam is soft and benign. Will start patient on steroid taper. Discussed hygiene precautions at home. Also discussed follow-up with PCP regarding further allergy testing. We'll prescribe patient with an epinephrine pen and discussed the indications for which this should be used..  Have discussed with the patient and available family all diagnostics and treatments performed thus far and all questions were answered to the best of my ability. The patient demonstrates understanding and agreement with plan.   Clinical Course     ____________________________________________   FINAL CLINICAL IMPRESSION(S) / ED DIAGNOSES  Final diagnoses:  Allergic reaction, initial encounter      NEW MEDICATIONS STARTED DURING THIS VISIT:  New Prescriptions   EPINEPHRINE 0.3 MG/0.3 ML IJ SOAJ INJECTION    Inject 0.3 mLs (0.3 mg total) into the muscle once.   PREDNISONE (DELTASONE) 20 MG TABLET    Take 2 tablets (40 mg total) by mouth daily.     Note:  This document was prepared using Dragon voice recognition software and may include unintentional dictation  errors.    Willy EddyPatrick Skya Mccullum, MD 05/12/16 [phone]

## 2016-05-17 ENCOUNTER — Encounter: Payer: Self-pay | Admitting: Emergency Medicine

## 2016-05-17 DIAGNOSIS — Z791 Long term (current) use of non-steroidal anti-inflammatories (NSAID): Secondary | ICD-10-CM | POA: Insufficient documentation

## 2016-05-17 DIAGNOSIS — F17210 Nicotine dependence, cigarettes, uncomplicated: Secondary | ICD-10-CM | POA: Insufficient documentation

## 2016-05-17 DIAGNOSIS — T7840XA Allergy, unspecified, initial encounter: Secondary | ICD-10-CM | POA: Insufficient documentation

## 2016-05-17 DIAGNOSIS — Z5321 Procedure and treatment not carried out due to patient leaving prior to being seen by health care provider: Secondary | ICD-10-CM | POA: Insufficient documentation

## 2016-05-17 DIAGNOSIS — J45909 Unspecified asthma, uncomplicated: Secondary | ICD-10-CM | POA: Insufficient documentation

## 2016-05-17 LAB — CBC
HEMATOCRIT: 40.4 % (ref 35.0–47.0)
Hemoglobin: 14.2 g/dL (ref 12.0–16.0)
MCH: 28.8 pg (ref 26.0–34.0)
MCHC: 35.3 g/dL (ref 32.0–36.0)
MCV: 81.7 fL (ref 80.0–100.0)
Platelets: 181 10*3/uL (ref 150–440)
RBC: 4.94 MIL/uL (ref 3.80–5.20)
RDW: 13.6 % (ref 11.5–14.5)
WBC: 8 10*3/uL (ref 3.6–11.0)

## 2016-05-17 LAB — BASIC METABOLIC PANEL
ANION GAP: 5 (ref 5–15)
BUN: 14 mg/dL (ref 6–20)
CO2: 31 mmol/L (ref 22–32)
Calcium: 8.5 mg/dL — ABNORMAL LOW (ref 8.9–10.3)
Chloride: 104 mmol/L (ref 101–111)
Creatinine, Ser: 0.92 mg/dL (ref 0.44–1.00)
Glucose, Bld: 83 mg/dL (ref 65–99)
POTASSIUM: 3.5 mmol/L (ref 3.5–5.1)
SODIUM: 140 mmol/L (ref 135–145)

## 2016-05-17 LAB — TROPONIN I: Troponin I: <0.03 ng/mL (ref <0.03)

## 2016-05-17 MED ORDER — DIPHENHYDRAMINE HCL 25 MG PO CAPS
50.0000 mg | ORAL_CAPSULE | Freq: Once | ORAL | Status: AC
  Administered 2016-05-17: 50 mg via ORAL
  Filled 2016-05-17: qty 2

## 2016-05-17 NOTE — ED Triage Notes (Addendum)
Pt states that she has been having an allergic reaction now for the past week. Pt reports red splotches all over, pt does have a couple visible red spots. Pt does have multiple bruises all over.

## 2016-05-18 ENCOUNTER — Emergency Department
Admission: EM | Admit: 2016-05-18 | Discharge: 2016-05-18 | Disposition: A | Discharge status: Home / Self Care | Payer: Medicaid Other | Attending: Emergency Medicine | Admitting: Emergency Medicine

## 2016-07-05 ENCOUNTER — Emergency Department
Admission: EM | Admit: 2016-07-05 | Discharge: 2016-07-05 | Disposition: A | Discharge status: Home / Self Care | Payer: Medicaid Other | Attending: Emergency Medicine | Admitting: Emergency Medicine

## 2016-07-05 DIAGNOSIS — F17210 Nicotine dependence, cigarettes, uncomplicated: Secondary | ICD-10-CM | POA: Insufficient documentation

## 2016-07-05 DIAGNOSIS — Z79899 Other long term (current) drug therapy: Secondary | ICD-10-CM | POA: Insufficient documentation

## 2016-07-05 DIAGNOSIS — J45909 Unspecified asthma, uncomplicated: Secondary | ICD-10-CM | POA: Insufficient documentation

## 2016-07-05 DIAGNOSIS — M545 Low back pain: Secondary | ICD-10-CM | POA: Insufficient documentation

## 2016-07-05 DIAGNOSIS — R55 Syncope and collapse: Secondary | ICD-10-CM | POA: Insufficient documentation

## 2016-07-05 LAB — URINALYSIS COMPLETE WITH MICROSCOPIC (ARMC ONLY)
BILIRUBIN URINE: NEGATIVE
Bacteria, UA: NONE SEEN
GLUCOSE, UA: NEGATIVE mg/dL
HGB URINE DIPSTICK: NEGATIVE
Ketones, ur: NEGATIVE mg/dL
LEUKOCYTES UA: NEGATIVE
Nitrite: NEGATIVE
Protein, ur: NEGATIVE mg/dL
Specific Gravity, Urine: 1.023 (ref 1.005–1.030)
pH: 5 (ref 5.0–8.0)

## 2016-07-05 LAB — BASIC METABOLIC PANEL
ANION GAP: 6 (ref 5–15)
BUN: 11 mg/dL (ref 6–20)
CHLORIDE: 107 mmol/L (ref 101–111)
CO2: 27 mmol/L (ref 22–32)
CREATININE: 0.84 mg/dL (ref 0.44–1.00)
Calcium: 8.8 mg/dL — ABNORMAL LOW (ref 8.9–10.3)
Glucose, Bld: 81 mg/dL (ref 65–99)
POTASSIUM: 3.8 mmol/L (ref 3.5–5.1)
Sodium: 140 mmol/L (ref 135–145)

## 2016-07-05 LAB — POCT PREGNANCY, URINE: Preg Test, Ur: NEGATIVE

## 2016-07-05 LAB — CBC
HCT: 43.5 % (ref 35.0–47.0)
Hemoglobin: 14.9 g/dL (ref 12.0–16.0)
MCH: 29 pg (ref 26.0–34.0)
MCHC: 34.2 g/dL (ref 32.0–36.0)
MCV: 85 fL (ref 80.0–100.0)
PLATELETS: 189 10*3/uL (ref 150–440)
RBC: 5.12 MIL/uL (ref 3.80–5.20)
RDW: 13.1 % (ref 11.5–14.5)
WBC: 7.1 10*3/uL (ref 3.6–11.0)

## 2016-07-05 LAB — GLUCOSE, CAPILLARY: GLUCOSE-CAPILLARY: 73 mg/dL (ref 65–99)

## 2016-07-05 MED ORDER — TRAMADOL HCL 50 MG PO TABS: 50.0000 mg | ORAL_TABLET | Freq: Four times a day (QID) | ORAL | 0 refills | Status: DC | PRN

## 2016-07-05 MED ORDER — TRAMADOL HCL 50 MG PO TABS
100.0000 mg | ORAL_TABLET | Freq: Once | ORAL | Status: AC
  Administered 2016-07-05: 100 mg via ORAL
  Filled 2016-07-05: qty 2

## 2016-07-05 NOTE — ED Notes (Addendum)
This RN went into room to discharge patient.  Patient not in room, pt not found in lobby or bathroom.  Spoke w/ pt on phone, pt stated that she did leave.  Informed pt that discharge paperwork at front desk.

## 2016-07-05 NOTE — ED Triage Notes (Signed)
Pt reports hx of frequent syncope, pt states that she has been breaking out in hives intermittently for awhile, pt states this am she started with hives again, then sweating, then had a sharp pain in the left side of her back and had a syncopal episode. Pt denies pain with urination, reports cont to have left flank pain

## 2016-07-05 NOTE — ED Provider Notes (Signed)
[facility] Emergency Department Provider Note  Time seen: 2:15 PM  I have reviewed the triage vital signs and the nursing notes.   HISTORY  Chief Complaint Loss of Consciousness    HPI Susan Swanson is a 20 y.o. female with a past medical history of anxiety, syncope, who presents to the emergency department if her second episode. According to the patient she was experiencing hives today which she states is very normal for her on a daily basis for the past 3 or 4 months. She tried to see an allergist but states it was too expensive. She has been taking Benadryl as needed for the hives. Has been unable to identify anything provoking the reaction. Patient states today she was having hives, took Benadryl and felt some pain in her lower back and had a syncopal blood cell. Patient states a history of very frequent syncopal episodes. Describes the pain in the back is mild to moderate, located in the midline of the lower back. Denies dysuria, denies abdominal pain.  Past Medical History:  Diagnosis Date  . Anxiety   . Asthma   . Syncope     There are no active problems to display for this patient.   Past Surgical History:  Procedure Laterality Date  . CESAREAN SECTION      Prior to Admission medications   Medication Sig Start Date End Date Taking? Authorizing Provider  etonogestrel (NEXPLANON) 68 MG IMPL implant Inject 68 mg into the skin once.    Historical Provider, MD  gabapentin (NEURONTIN) 300 MG capsule Take 300 mg by mouth 6 (six) times daily.    Historical Provider, MD  HYDROcodone-acetaminophen (NORCO) 5-325 MG tablet Take 1-2 tablets by mouth every 4 (four) hours as needed for moderate pain. 02/19/16   Evangeline Dakinharles M Beers, PA-C  naproxen (NAPROSYN) 500 MG tablet Take 1 tablet (500 mg total) by mouth 2 (two) times daily with a meal. 02/19/16   Evangeline Dakinharles M Beers, PA-C    No Known Allergies  No family history on file.  Social History Social History   Substance Use Topics  . Smoking status: Current Every Day Smoker    Packs/day: 1.00    Types: Cigarettes  . Smokeless tobacco: Never Used  . Alcohol use Yes     Comment: occassionally    Review of Systems Constitutional: Negative for fever. Cardiovascular: Negative for chest pain. Respiratory: Negative for shortness of breath. Gastrointestinal: Negative for abdominal pain Neurological: Negative for headache 10-point ROS otherwise negative.  ____________________________________________   PHYSICAL EXAM:  VITAL SIGNS: ED Triage Vitals  Enc Vitals Group     BP 07/05/16 1049 121/67     Pulse Rate 07/05/16 1049 95     Resp 07/05/16 1049 18     Temp 07/05/16 1049 98.2 F (36.8 C)     Temp Source 07/05/16 1049 Oral     SpO2 07/05/16 1049 100 %     Weight 07/05/16 1050 260 lb (117.9 kg)     Height 07/05/16 1050 5\' 4"  (1.626 m)     Head Circumference --      Peak Flow --      Pain Score 07/05/16 1053 7     Pain Loc --      Pain Edu? --      Excl. in GC? --     Constitutional: Alert and oriented. Well appearing and in no distress. Eyes: Normal exam ENT   Head: Normocephalic and atraumatic.   Mouth/Throat: Mucous membranes  are moist. Cardiovascular: Normal rate, regular rhythm. No murmur Respiratory: Normal respiratory effort without tachypnea nor retractions. Breath sounds are clear Gastrointestinal: Soft and nontender. No distention.  Musculoskeletal: Nontender with normal range of motion in all extremities. Mild midline lumbar tenderness to palpation. Neurologic:  Normal speech and language. No gross focal neurologic deficits  Skin:  Skin is warm, dry and intact.  Psychiatric: Mood and affect are normal.   ____________________________________________    EKG  EKG reviewed and interpreted by myself shows normal sinus rhythm at 80 bpm, narrow QRS, normal axis, normal intervals, no ST changes.  ____________________________________________    INITIAL  IMPRESSION / ASSESSMENT AND PLAN / ED COURSE  Pertinent labs & imaging results that were available during my care of the patient were reviewed by me and considered in my medical decision making (see chart for details).  Patient presents the emergency department with lower back pain, syncope episode. Patient has frequent sig the episodes. Patient's labs are within normal limits. Pregnancy test is negative. Patient's EKG is normal. Patient states she is feeling better with size mild lower back pain. She has mild lumbar tenderness palpation in the midline. No CVA tenderness.  ____________________________________________   FINAL CLINICAL IMPRESSION(S) / ED DIAGNOSES  Syncope Back pain    Minna AntisKevin Clarence Dunsmore, MD 07/05/16 1428

## 2016-07-05 NOTE — ED Notes (Signed)
Pt is sitting comfortably in bed - states has seen her pcp for the recurrent syncopal episodes (and a cardiologist) without being given a dx. Also wants us to treat her hives that she had earlier but no longer has and wants to know why she gets them daily. States she can't afford to see a dermatologist.

## 2016-11-01 ENCOUNTER — Emergency Department
Admission: EM | Admit: 2016-11-01 | Discharge: 2016-11-01 | Disposition: A | Discharge status: Home / Self Care | Payer: Self-pay | Attending: Emergency Medicine | Admitting: Emergency Medicine

## 2016-11-01 ENCOUNTER — Emergency Department: Payer: Self-pay

## 2016-11-01 ENCOUNTER — Encounter: Payer: Self-pay | Admitting: Emergency Medicine

## 2016-11-01 DIAGNOSIS — R112 Nausea with vomiting, unspecified: Secondary | ICD-10-CM | POA: Insufficient documentation

## 2016-11-01 DIAGNOSIS — J45909 Unspecified asthma, uncomplicated: Secondary | ICD-10-CM | POA: Insufficient documentation

## 2016-11-01 DIAGNOSIS — R55 Syncope and collapse: Secondary | ICD-10-CM | POA: Insufficient documentation

## 2016-11-01 DIAGNOSIS — F17210 Nicotine dependence, cigarettes, uncomplicated: Secondary | ICD-10-CM | POA: Insufficient documentation

## 2016-11-01 DIAGNOSIS — R079 Chest pain, unspecified: Secondary | ICD-10-CM | POA: Insufficient documentation

## 2016-11-01 DIAGNOSIS — Z79899 Other long term (current) drug therapy: Secondary | ICD-10-CM | POA: Insufficient documentation

## 2016-11-01 LAB — CBC
HCT: 41.3 % (ref 35.0–47.0)
HEMOGLOBIN: 14.3 g/dL (ref 12.0–16.0)
MCH: 29.3 pg (ref 26.0–34.0)
MCHC: 34.6 g/dL (ref 32.0–36.0)
MCV: 84.8 fL (ref 80.0–100.0)
PLATELETS: 204 10*3/uL (ref 150–440)
RBC: 4.87 MIL/uL (ref 3.80–5.20)
RDW: 13.5 % (ref 11.5–14.5)
WBC: 8.1 10*3/uL (ref 3.6–11.0)

## 2016-11-01 LAB — URINALYSIS, COMPLETE (UACMP) WITH MICROSCOPIC
Bacteria, UA: NONE SEEN
Bilirubin Urine: NEGATIVE
GLUCOSE, UA: NEGATIVE mg/dL
Hgb urine dipstick: NEGATIVE
Ketones, ur: NEGATIVE mg/dL
Leukocytes, UA: NEGATIVE
Nitrite: NEGATIVE
PH: 6 (ref 5.0–8.0)
Protein, ur: NEGATIVE mg/dL
SPECIFIC GRAVITY, URINE: 1.01 (ref 1.005–1.030)

## 2016-11-01 LAB — BASIC METABOLIC PANEL
ANION GAP: 7 (ref 5–15)
BUN: 16 mg/dL (ref 6–20)
CALCIUM: 8.8 mg/dL — AB (ref 8.9–10.3)
CO2: 26 mmol/L (ref 22–32)
Chloride: 106 mmol/L (ref 101–111)
Creatinine, Ser: 0.74 mg/dL (ref 0.44–1.00)
GFR calc Af Amer: >60 mL/min (ref >60)
GLUCOSE: 89 mg/dL (ref 65–99)
Potassium: 3.8 mmol/L (ref 3.5–5.1)
Sodium: 139 mmol/L (ref 135–145)

## 2016-11-01 LAB — TROPONIN I

## 2016-11-01 LAB — POCT PREGNANCY, URINE: Preg Test, Ur: NEGATIVE

## 2016-11-01 LAB — FIBRIN DERIVATIVES D-DIMER (ARMC ONLY): Fibrin derivatives D-dimer (ARMC): 283.03 (ref 0–499)

## 2016-11-01 MED ORDER — KETOROLAC TROMETHAMINE 30 MG/ML IJ SOLN
30.0000 mg | Freq: Once | INTRAMUSCULAR | Status: AC
Start: 2016-11-01 — End: 2016-11-01
  Administered 2016-11-01: 30 mg via INTRAVENOUS
  Filled 2016-11-01: qty 1

## 2016-11-01 MED ORDER — SODIUM CHLORIDE 0.9 % IV BOLUS (SEPSIS)
1000.0000 mL | Freq: Once | INTRAVENOUS | Status: AC
  Administered 2016-11-01: 1000 mL via INTRAVENOUS

## 2016-11-01 NOTE — ED Triage Notes (Signed)
States approx 9am at work has sensation she was going to pass out and woke up on ground. States this has happened to her multiple times but cannot states why. States has been told something about a valve in her heart but cant state problem. States has slight chest pain. States had chest pain prior to fall.

## 2016-11-01 NOTE — ED Provider Notes (Signed)
[facility] Emergency Department Provider Note  ____________________________________________   First MD Initiated Contact with Patient 11/01/16 1001     (approximate)  I have reviewed the triage vital signs and the nursing notes.   HISTORY  Chief Complaint Loss of Consciousness   HPI Susan Swanson is a 21 y.o. female with a history of anxiety as well as syncope was presented to the emergency department today after syncopal episode. She says that she was standing up at work for prolonged period of time when she started to feel chest pain across the lower and central portion of her chest. She says that she then became lightheaded and passed out. She says that she now has pain to the back of her head and has vomited several times and so the sensation of nausea. She says that she also has a very dull chest pain and feels like a tightness across the bottom front of her chest at this time as well. She says that she has not vomited in the past after having similar episodes although she has had multiple syncopal episodes in the past. She's been seen in the emergency department only and has not been worked up by cardiologist or her primary care doctor. She says that she does not of the money to do these follow-up appointments which is the reason why she has not been seen for further evaluation. She denies any shortness of breath. Denies any radiation of the pain. Denies any smoking drinking or drug use. Denies any sudden cardiac death in young people in her family.Unclear of exact duration of unconsciousness the patient suspects was several minutes. She says that she has had chest pain in the past with these episodes.   Past Medical History:  Diagnosis Date  . Anxiety   . Asthma   . Syncope     There are no active problems to display for this patient.   Past Surgical History:  Procedure Laterality Date  . CESAREAN SECTION      Prior to Admission medications     Medication Sig Start Date End Date Taking? Authorizing Provider  albuterol (PROVENTIL HFA;VENTOLIN HFA) 108 (90 Base) MCG/ACT inhaler Inhale 2 puffs into the lungs every 2 (two) hours as needed. Inhale 2 puffs into the lungs every 2-4 hours as needed. 06/18/16  Yes Historical Provider, MD  etonogestrel (NEXPLANON) 68 MG IMPL implant Inject 68 mg into the skin once.   Yes Historical Provider, MD  gabapentin (NEURONTIN) 600 MG tablet Take 600 mg by mouth 3 (three) times daily. 09/22/16  Yes Historical Provider, MD  HYDROcodone-acetaminophen (NORCO) 5-325 MG tablet Take 1-2 tablets by mouth every 4 (four) hours as needed for moderate pain. Patient not taking: Reported on 11/01/2016 02/19/16   Charmayne Sheerharles M Beers, PA-C  naproxen (NAPROSYN) 500 MG tablet Take 1 tablet (500 mg total) by mouth 2 (two) times daily with a meal. Patient not taking: Reported on 11/01/2016 02/19/16   Charmayne Sheerharles M Beers, PA-C  traMADol (ULTRAM) 50 MG tablet Take 1 tablet (50 mg total) by mouth every 6 (six) hours as needed. Patient not taking: Reported on 11/01/2016 07/05/16 07/05/17  Minna AntisKevin Paduchowski, MD    Allergies Patient has no known allergies.  No family history on file.  Social History Social History  Substance Use Topics  . Smoking status: Current Every Day Smoker    Packs/day: 0.50    Types: Cigarettes  . Smokeless tobacco: Never Used  . Alcohol use Yes  Comment: occassionally    Review of Systems Constitutional: No fever/chills Eyes: No visual changes. ENT: No sore throat. Cardiovascular: as above Respiratory: Denies shortness of breath. Gastrointestinal: No abdominal pain.  No nausea, no vomiting.  No diarrhea.  No constipation. Genitourinary: Negative for dysuria. Musculoskeletal: Negative for back pain. Skin: Negative for rash. Neurological: Negative for headaches, focal weakness or numbness.  10-point ROS otherwise negative.  ____________________________________________   PHYSICAL EXAM:  VITAL  SIGNS: ED Triage Vitals  Enc Vitals Group     BP 11/01/16 0944 124/67     Pulse Rate 11/01/16 0944 82     Resp 11/01/16 0944 18     Temp 11/01/16 0944 97.5 F (36.4 C)     Temp src --      SpO2 11/01/16 0944 100 %     Weight 11/01/16 0948 200 lb (90.7 kg)     Height 11/01/16 0948 5\' 5"  (1.651 m)     Head Circumference --      Peak Flow --      Pain Score 11/01/16 0948 10     Pain Loc --      Pain Edu? --      Excl. in GC? --     Constitutional: Alert and oriented. Well appearing and in no acute distress. Eyes: Conjunctivae are normal. PERRL. EOMI. Head: Atraumatic. Nose: No congestion/rhinnorhea. Mouth/Throat: Mucous membranes are moist.   Neck: No stridor.   Cardiovascular: Normal rate, regular rhythm. Grossly normal heart sounds.  Chest pain is reproducible on palpation over the lower left chest. Respiratory: Normal respiratory effort.  No retractions. Lungs CTAB. Gastrointestinal: Soft and nontender. No distention.  Musculoskeletal: No lower extremity tenderness nor edema.  No joint effusions. Neurologic:  Normal speech and language. No gross focal neurologic deficits are appreciated.  Skin:  Skin is warm, dry and intact. No rash noted. Psychiatric: Mood and affect are normal. Speech and behavior are normal.  ____________________________________________   LABS (all labs ordered are listed, but only abnormal results are displayed)  Labs Reviewed  BASIC METABOLIC PANEL - Abnormal; Notable for the following:       Result Value   Calcium 8.8 (*)    All other components within normal limits  URINALYSIS, COMPLETE (UACMP) WITH MICROSCOPIC - Abnormal; Notable for the following:    Color, Urine STRAW (*)    APPearance CLEAR (*)    Squamous Epithelial / LPF 0-5 (*)    All other components within normal limits  CBC  TROPONIN I  FIBRIN DERIVATIVES D-DIMER ([facility] ONLY)  TROPONIN I  POC URINE PREG, ED  POCT PREGNANCY, URINE    ____________________________________________  EKG  ED ECG REPORT I, Nylan Nevel,  Teena Irani, the attending physician, personally viewed and interpreted this ECG.   Date: 11/01/2016  EKG Time: 0953  Rate: 72  Rhythm: normal sinus rhythm  Axis: Normal  Intervals:none  ST&T Change: No ST segment elevation or depression. Biphasic T-wave in V2. Biphasic T waves seen on the EKG of 07/06/2016. EKG today without change from previous.  ____________________________________________  RADIOLOGY    DG Chest 2 View (Final result)  Result time 11/01/16 11:39:17  Final result by Norva Pavlov, MD (11/01/16 11:39:17)           Narrative:   CLINICAL DATA: States approx 9am at work has sensation she was going to pass out and woke up on ground. States this has happened to her multiple times but cannot states why. States has been told something about a  valve in her heart but cannot state the reason.  EXAM: CHEST 2 VIEW  COMPARISON: 01/15/2016  FINDINGS: The heart size and mediastinal contours are within normal limits. Both lungs are clear. The visualized skeletal structures are unremarkable.  IMPRESSION: No active cardiopulmonary disease.   Electronically Signed By: Norva Pavlov M.D. On: 11/01/2016 11:39            CT Head Wo Contrast (Final result)  Result time 11/01/16 11:27:22  Final result by Richarda Overlie, MD (11/01/16 11:27:22)           Narrative:   CLINICAL DATA: Passed out and fell. Posterior head pain.  EXAM: CT HEAD WITHOUT CONTRAST  TECHNIQUE: Contiguous axial images were obtained from the base of the skull through the vertex without intravenous contrast.  COMPARISON: 07/06/2012  FINDINGS: Brain: No evidence of acute infarction, hemorrhage, hydrocephalus, extra-axial collection or mass lesion/mass effect.  Vascular: No hyperdense vessel or unexpected calcification.  Skull: Normal. Negative for fracture or focal  lesion.  Sinuses/Orbits: There may be minimal disease in the ethmoid air cells which are incompletely imaged.  Other: None.  IMPRESSION: Negative head CT.   Electronically Signed By: Richarda Overlie M.D. On: 11/01/2016 11:27            ____________________________________________   PROCEDURES  Procedure(s) performed:   Procedures  Critical Care performed:   ____________________________________________   INITIAL IMPRESSION / ASSESSMENT AND PLAN / ED COURSE  Pertinent labs & imaging results that were available during my care of the patient were reviewed by me and considered in my medical decision making (see chart for details).  ----------------------------------------- 3:08 PM on 11/01/2016 -----------------------------------------  Patient asymptomatic at this time. Very reassuring workup. We'll give her cardiology as well as primary care follow-up. Patient says that this is never happened while driving and is only happened while standing up. We discussed the lab as well as imaging results and the need for follow-up. The patient is understanding of this plan and willing to comply.       ____________________________________________   FINAL CLINICAL IMPRESSION(S) / ED DIAGNOSES  Chest pain. Syncope.    NEW MEDICATIONS STARTED DURING THIS VISIT:  New Prescriptions   No medications on file     Note:  This document was prepared using Dragon voice recognition software and may include unintentional dictation errors.    Myrna Blazer, MD 11/01/16 [phone]

## 2016-11-01 NOTE — ED Notes (Signed)
Pt given graham cracker, peanut butter, and sprite to drink.

## 2016-11-10 ENCOUNTER — Emergency Department: Payer: Medicaid Other

## 2016-11-10 ENCOUNTER — Encounter: Payer: Self-pay | Admitting: Emergency Medicine

## 2016-11-10 ENCOUNTER — Emergency Department
Admission: EM | Admit: 2016-11-10 | Discharge: 2016-11-10 | Disposition: A | Discharge status: Home / Self Care | Payer: Medicaid Other | Attending: Emergency Medicine | Admitting: Emergency Medicine

## 2016-11-10 DIAGNOSIS — Y999 Unspecified external cause status: Secondary | ICD-10-CM | POA: Insufficient documentation

## 2016-11-10 DIAGNOSIS — S93401A Sprain of unspecified ligament of right ankle, initial encounter: Secondary | ICD-10-CM | POA: Insufficient documentation

## 2016-11-10 DIAGNOSIS — Y9389 Activity, other specified: Secondary | ICD-10-CM | POA: Insufficient documentation

## 2016-11-10 DIAGNOSIS — Z79899 Other long term (current) drug therapy: Secondary | ICD-10-CM | POA: Insufficient documentation

## 2016-11-10 DIAGNOSIS — J45909 Unspecified asthma, uncomplicated: Secondary | ICD-10-CM | POA: Insufficient documentation

## 2016-11-10 DIAGNOSIS — F17210 Nicotine dependence, cigarettes, uncomplicated: Secondary | ICD-10-CM | POA: Insufficient documentation

## 2016-11-10 DIAGNOSIS — Y929 Unspecified place or not applicable: Secondary | ICD-10-CM | POA: Insufficient documentation

## 2016-11-10 DIAGNOSIS — X501XXA Overexertion from prolonged static or awkward postures, initial encounter: Secondary | ICD-10-CM | POA: Insufficient documentation

## 2016-11-10 NOTE — Discharge Instructions (Signed)
Your exam and x-ray are consistent with an ankle sprain. Take OTC ibuprofen 3-4 tabs, with food, 3 times a day for pain and inflammation. Apply ice to reduce swelling. Wear the ankle brace when out of bed, as needed. Follow-up with Dr. Ether GriffinsFowler for continued ankle symptoms.

## 2016-11-10 NOTE — ED Provider Notes (Signed)
[facility] Emergency Department Provider Note ____________________________________________  Time seen: 1928  I have reviewed the triage vital signs and the nursing notes.  HISTORY  Chief Complaint  Ankle Pain  HPI Susan Swanson is a 21 y.o. female presents to the ED for evaluation of a presumed ankle sprain. She reports getting up from the couch, suddenly, and apparently rolled her ankle because her "foot was asleep and my toes went under my foot."  She denies any other injury at this time.   Past Medical History:  Diagnosis Date  . Anxiety   . Asthma   . Syncope     There are no active problems to display for this patient.   Past Surgical History:  Procedure Laterality Date  . CESAREAN SECTION      Prior to Admission medications   Medication Sig Start Date End Date Taking? Authorizing Provider  albuterol (PROVENTIL HFA;VENTOLIN HFA) 108 (90 Base) MCG/ACT inhaler Inhale 2 puffs into the lungs every 2 (two) hours as needed. Inhale 2 puffs into the lungs every 2-4 hours as needed. 06/18/16   Historical Provider, MD  etonogestrel (NEXPLANON) 68 MG IMPL implant Inject 68 mg into the skin once.    Historical Provider, MD  gabapentin (NEURONTIN) 600 MG tablet Take 600 mg by mouth 3 (three) times daily. 09/22/16   Historical Provider, MD  HYDROcodone-acetaminophen (NORCO) 5-325 MG tablet Take 1-2 tablets by mouth every 4 (four) hours as needed for moderate pain. Patient not taking: Reported on 11/01/2016 02/19/16   Charmayne Sheer Beers, PA-C  naproxen (NAPROSYN) 500 MG tablet Take 1 tablet (500 mg total) by mouth 2 (two) times daily with a meal. Patient not taking: Reported on 11/01/2016 02/19/16   Charmayne Sheer Beers, PA-C  traMADol (ULTRAM) 50 MG tablet Take 1 tablet (50 mg total) by mouth every 6 (six) hours as needed. Patient not taking: Reported on 11/01/2016 07/05/16 07/05/17  Minna Antis, MD    Allergies Patient has no known allergies.  No family history on  file.  Social History Social History  Substance Use Topics  . Smoking status: Current Every Day Smoker    Packs/day: 0.50    Types: Cigarettes  . Smokeless tobacco: Never Used  . Alcohol use Yes     Comment: occassionally    Review of Systems  Constitutional: Negative for fever. Musculoskeletal: Negative for back pain. Right ankle pain as above.  Skin: Negative for rash. Neurological: Negative for headaches, focal weakness or numbness. ____________________________________________  PHYSICAL EXAM:  VITAL SIGNS: ED Triage Vitals  Enc Vitals Group     BP 11/10/16 1912 112/72     Pulse Rate 11/10/16 1912 81     Resp 11/10/16 1912 18     Temp 11/10/16 1912 97.8 F (36.6 C)     Temp Source 11/10/16 1912 Oral     SpO2 11/10/16 1912 100 %     Weight 11/10/16 1913 210 lb (95.3 kg)     Height 11/10/16 1913 5\' 5"  (1.651 m)     Head Circumference --      Peak Flow --      Pain Score 11/10/16 1913 10     Pain Loc --      Pain Edu? --      Excl. in GC? --     Constitutional: Alert and oriented. Well appearing and in no distress. Head: Normocephalic and atraumatic. Cardiovascular: Normal rate, regular rhythm. Normal distal pulses. Musculoskeletal: Right ankle with lateral STS noted over  the lateral malleolus. No calf or achilles tenderness. Negative Thompson test. normal ankle ROM noted. Nontender with normal range of motion in all extremities.  Neurologic:  Normal gross sensation. Normal speech and language. No gross focal neurologic deficits are appreciated. Skin:  Skin is warm, dry and intact. No rash noted. ____________________________________________   RADIOLOGY  Right Ankle IMPRESSION: Soft tissue swelling overlying the lateral malleolus. No evidence for acute osseous abnormality.  I, Barbarita Hutmacher, Charlesetta IvoryJenise V Bacon, personally viewed and evaluated these images (plain radiographs) as part of my medical decision making, as well as reviewing the written report by the  radiologist. ____________________________________________  PROCEDURES  Velcro Stirrup splint Crutches ____________________________________________  INITIAL IMPRESSION / ASSESSMENT AND PLAN / ED COURSE  Patient with a Grade II right ankle sprain, without evidence of a fracture/dislocation. She is fitted with a splint and crutches. She is to dose ibuprofen for pain relief. She will follow-up with podiatry for ongoing symptom management. A work note for tomorrow is provided.  ____________________________________________  FINAL CLINICAL IMPRESSION(S) / ED DIAGNOSES  Final diagnoses:  Sprain of right ankle, unspecified ligament, initial encounter     Lissa HoardJenise V Bacon Breia Ocampo, PA-C 11/10/16 2122    Arnaldo NatalPaul F Malinda, MD 11/10/16 2129

## 2016-11-10 NOTE — ED Triage Notes (Signed)
Patient to ER for pain and swelling to right ankle. Patient ambulatory to stat desk. Patient has moderate amount of swelling present with no obvious deformity. Patient states she got up off of her cough to go check on her daughter, "foot was asleep and my toes went under my foot".

## 2016-11-13 ENCOUNTER — Ambulatory Visit (INDEPENDENT_AMBULATORY_CARE_PROVIDER_SITE_OTHER): Payer: Self-pay | Admitting: Cardiology

## 2016-11-13 ENCOUNTER — Ambulatory Visit: Payer: Self-pay

## 2016-11-13 ENCOUNTER — Encounter: Payer: Self-pay | Admitting: Cardiology

## 2016-11-13 VITALS — BP 108/74 | HR 84 | Ht 65.0 in | Wt 226.5 lb

## 2016-11-13 DIAGNOSIS — R0602 Shortness of breath: Secondary | ICD-10-CM

## 2016-11-13 DIAGNOSIS — R002 Palpitations: Secondary | ICD-10-CM

## 2016-11-13 DIAGNOSIS — R079 Chest pain, unspecified: Secondary | ICD-10-CM

## 2016-11-13 DIAGNOSIS — R55 Syncope and collapse: Secondary | ICD-10-CM

## 2016-11-13 NOTE — Patient Instructions (Addendum)
Testing/Procedures: Your physician has requested that you have an echocardiogram. Echocardiography is a painless test that uses sound waves to create images of your heart. It provides your doctor with information about the size and shape of your heart and how well your heart's chambers and valves are working. This procedure takes approximately one hour. There are no restrictions for this procedure.  Your physician has requested that you have a stress echocardiogram. For further information please visit [email or web address] Please follow instruction sheet as given.   Do not drink or eat foods with caffeine for 24 hours before the test. (Chocolate, coffee, tea, or energy drinks)  If you use an inhaler, bring it with you to the test.  Do not smoke for 4 hours before the test.  Wear comfortable shoes and clothing.   Your physician has recommended that you wear an event monitor. Event monitors are medical devices that record the heart's electrical activity. Doctors most often [place] these monitors to diagnose arrhythmias. Arrhythmias are problems with the speed or rhythm of the heartbeat. The monitor is a small, portable device. You can wear one while you do your normal daily activities. This is usually used to diagnose what is causing palpitations/syncope (passing out).    Follow-Up: Your physician recommends that you schedule a follow-up appointment after testing.    It was a pleasure seeing you today here in the office. Please do not hesitate to give [place] a call back if you have any further questions. [phone]  Easton Cellar RN, BSN    Echocardiogram An echocardiogram, or echocardiography, uses sound waves (ultrasound) to produce an image of your heart. The echocardiogram is simple, painless, obtained within a short period of time, and offers valuable information to your health care provider. The images from an echocardiogram can provide information such as:  Evidence of coronary artery  disease (CAD).  Heart size.  Heart muscle function.  Heart valve function.  Aneurysm detection.  Evidence of a past heart attack.  Fluid buildup around the heart.  Heart muscle thickening.  Assess heart valve function. Tell a health care provider about:  Any allergies you have.  All medicines you are taking, including vitamins, herbs, eye drops, creams, and over-the-counter medicines.  Any problems you or family members have had with anesthetic medicines.  Any blood disorders you have.  Any surgeries you have had.  Any medical conditions you have.  Whether you are pregnant or may be pregnant. What happens before the procedure? No special preparation is needed. Eat and drink normally. What happens during the procedure?  In order to produce an image of your heart, gel will be applied to your chest and a wand-like tool (transducer) will be moved over your chest. The gel will help transmit the sound waves from the transducer. The sound waves will harmlessly bounce off your heart to allow the heart images to be captured in real-time motion. These images will then be recorded.  You may need an IV to receive a medicine that improves the quality of the pictures. What happens after the procedure? You may return to your normal schedule including diet, activities, and medicines, unless your health care provider tells you otherwise. This information is not intended to replace advice given to you by your health care provider. Make sure you discuss any questions you have with your health care provider. Document Released: 08/15/2000 Document Revised: 04/05/2016 Document Reviewed: 04/25/2013 Elsevier Interactive Patient Education  2017 [employer]  Exercise Stress Echocardiogram An exercise stress echocardiogram is  a test that checks how well your heart is working. For this test, you will walk on a treadmill to make your heart beat faster. This test uses sound waves (ultrasound) and  a computer to make pictures (images) of your heart. These pictures will be taken before you exercise and after you exercise. What happens before the procedure?  Follow instructions from your doctor about what you cannot eat or drink before the test.  Do not drink or eat anything that has caffeine in it. Stop having caffeine for 24 hours before the test.  Ask your doctor about changing or stopping your normal medicines. This is important if you take diabetes medicines or blood thinners. Ask your doctor if you should take your medicines with water before the test.  If you use an inhaler, bring it to the test.  Do not use any products that have nicotine or tobacco in them, such as cigarettes and e-cigarettes. Stop using them for 4 hours before the test. If you need help quitting, ask your doctor.  Wear comfortable shoes and clothing. What happens during the procedure?  You will be hooked up to a TV screen. Your doctor will watch the screen to see how fast your heart beats during the test.  Before you exercise, a computer will make a picture of your heart. To do this:  A gel will be put on your chest.  A wand will be moved over the gel.  Sound waves from the wand will go to the computer to make the picture.  Your will start walking on a treadmill. The treadmill will start at a slow speed. It will get faster a little bit at a time. When you walk faster, your heart will beat faster.  The treadmill will be stopped when your heart is working hard.  You will lie down right away so another picture of your heart can be taken.  The test will take 30-60 minutes. What happens after the procedure?  Your heart rate and blood pressure will be watched after the test.  If your doctor says that you can, you may:  Eat what you usually eat.  Do your normal activities.  Take medicines like normal. Summary  An exercise stress echocardiogram is a test that checks how well your heart is  working.  Follow instructions about what you cannot eat or drink before the test. Ask your doctor if you should take your normal medicines before the test.  Stop having caffeine for 24 hours before the test. Do not use anything with nicotine or tobacco in it for 4 hours before the test.  A computer will take a picture of your heart before you walk on a treadmill. It will take another picture when you are done walking.  Your heart rate and blood pressure will be watched after the test. This information is not intended to replace advice given to you by your health care provider. Make sure you discuss any questions you have with your health care provider. Document Released: 06/15/2009 Document Revised: 05/11/2016 Document Reviewed: 05/11/2016 Elsevier Interactive Patient Education  2017 [employer]

## 2016-11-13 NOTE — Progress Notes (Signed)
Cardiology Office Note   Date:  11/13/2016   ID:  Susan Swanson, DOB 10-19-1995, MRN [id number]  Referring Doctor:  [facility]   Cardiologist:   Almond Lint, MD   Reason for consultation:  Chief Complaint  Patient presents with  . other    Syncope. Pt mentioed recently told that she had "mitral valve problem." Meds reviewed verbally with pt.      History of Present Illness: Susan Swanson is a 21 y.o. female who presents for Episodes of passing out, chest pain, palpitations.  She reports that she's had episodes of passing out since fifth grade. She does not recall any cardiac workup previously. Increased frequency in the last 2 months. More recent episodes of passing out are related to prolonged standing or sudden change in position, accompanied by pain in the epigastric area, sharp pain, and also symptoms of palpitations. She usually knows that she is about to pass out: She feels weak, her vision turns dim, and the room starts spinning. She can usually sit down to abort it.  She also reports shortness of breath with minimal walking. He is not very physically active.  Patient denies PND, orthopnea, edema.   ROS:  Please see the history of present illness. Aside from mentioned under HPI, all other systems are reviewed and negative.     Past Medical History:  Diagnosis Date  . Anxiety   . Asthma   . Syncope     Past Surgical History:  Procedure Laterality Date  . CESAREAN SECTION       reports that she has quit smoking. Her smoking use included Cigarettes. She smoked 0.50 packs per day. She has never used smokeless tobacco. She reports that she drinks alcohol. She reports that she uses drugs.   family history is not on file.   Outpatient Medications Prior to Visit  Medication Sig Dispense Refill  . albuterol (PROVENTIL HFA;VENTOLIN HFA) 108 (90 Base) MCG/ACT inhaler Inhale 2 puffs into the lungs every 2 (two) hours as needed. Inhale 2 puffs  into the lungs every 2-4 hours as needed.    . etonogestrel (NEXPLANON) 68 MG IMPL implant Inject 68 mg into the skin once.    . gabapentin (NEURONTIN) 600 MG tablet Take 600 mg by mouth 3 (three) times daily.  99  . HYDROcodone-acetaminophen (NORCO) 5-325 MG tablet Take 1-2 tablets by mouth every 4 (four) hours as needed for moderate pain. (Patient not taking: Reported on 11/01/2016) 15 tablet 0  . naproxen (NAPROSYN) 500 MG tablet Take 1 tablet (500 mg total) by mouth 2 (two) times daily with a meal. (Patient not taking: Reported on 11/01/2016) 60 tablet 0  . traMADol (ULTRAM) 50 MG tablet Take 1 tablet (50 mg total) by mouth every 6 (six) hours as needed. (Patient not taking: Reported on 11/01/2016) 20 tablet 0   No facility-administered medications prior to visit.      Allergies: Patient has no known allergies.    PHYSICAL EXAM: VS:  BP 108/74 (BP Location: Right Arm, Patient Position: Sitting, Cuff Size: Large)   Pulse 84   Ht 5\' 5"  (1.651 m)   Wt 226 lb 8 oz (102.7 kg)   BMI 37.69 kg/m  , Body mass index is 37.69 kg/m. Wt Readings from Last 3 Encounters:  11/13/16 226 lb 8 oz (102.7 kg)  11/10/16 210 lb (95.3 kg)  11/01/16 200 lb (90.7 kg)    GENERAL:  well developed, well nourished, obese, not in  acute distress HEENT: normocephalic, pink conjunctivae, anicteric sclerae, no xanthelasma, normal dentition, oropharynx clear NECK:  no neck vein engorgement, JVP normal, no hepatojugular reflux, carotid upstroke brisk and symmetric, no bruit, no thyromegaly, no lymphadenopathy LUNGS:  good respiratory effort, clear to auscultation bilaterally CV:  PMI not displaced, no thrills, no lifts, S1 and S2 within normal limits, no palpable S3 or S4, no murmurs, no rubs, no gallops ABD:  Soft, nontender, nondistended, normoactive bowel sounds, no abdominal aortic bruit, no hepatomegaly, no splenomegaly MS: nontender back, no kyphosis, no scoliosis, no joint deformities EXT:  2+ DP/PT pulses, no  edema, no varicosities, no cyanosis, no clubbing SKIN: warm, nondiaphoretic, normal turgor, no ulcers NEUROPSYCH: alert, oriented to person, place, and time, sensory/motor grossly intact, normal mood, appropriate affect  Recent Labs: 11/01/2016: BUN 16; Creatinine, Ser 0.74; Hemoglobin 14.3; Platelets 204; Potassium 3.8; Sodium 139   Lipid Panel No results found for: CHOL, TRIG, HDL, CHOLHDL, VLDL, LDLCALC, LDLDIRECT   Other studies Reviewed:  EKG:  The ekg from 11/13/2016 was personally reviewed by me and it revealed sinus rhythm, 84 BPM.  Additional studies/ records that were reviewed personally reviewed by me today include: None available   ASSESSMENT AND PLAN: Syncope Palpitations Chest pain Shortness breath  Episodes of syncope may have an element of orthostasis. Context is consistent with that, prolonged standing, sudden change in position. Recommend maneuvers to avoid oral board episodes. Consider compression stockings. Continue improving hydration.  We will need to evaluate her to rule out cardiac etiologies including arrhythmia. Need to do a long-term monitor. Recommend echocardiogram. Rule out ischemia with stress echocardiogram.   Current medicines are reviewed at length with the patient today.  The patient does not have concerns regarding medicines.  Labs/ tests ordered today include:  Orders Placed This Encounter  Procedures  . LONG TERM MONITOR (3-14 DAYS)  . EKG 12-Lead  . ECHOCARDIOGRAM COMPLETE  . ECHOCARDIOGRAM STRESS TEST    I had a lengthy and detailed discussion with the patient regarding diagnoses, prognosis, diagnostic options, treatment options , and side effects of medications.   I counseled the patient on importance of lifestyle modification including heart healthy diet, regular physical activity .   Disposition:   FU with Cardiology after tests   Thank you for this consultation. We will forwarding this consultation to referring physician.    I spent at least 60 minutes with the patient today and more than 50% of the time was spent counseling the patient and coordinating care.    Signed, Almond LintAileen Bradyn Soward, MD  11/13/2016 4:47 PM    [facility]  This note was generated in part with voice recognition software and I apologize for any typographical errors that were not detected and corrected.

## 2016-12-18 ENCOUNTER — Telehealth: Payer: Self-pay | Admitting: Cardiology

## 2016-12-18 DIAGNOSIS — R002 Palpitations: Secondary | ICD-10-CM

## 2016-12-18 NOTE — Telephone Encounter (Signed)
No answer/Voicemail is full. Attempting to call regarding unreturned zio monitor.

## 2016-12-19 ENCOUNTER — Encounter: Payer: Self-pay | Admitting: Emergency Medicine

## 2016-12-19 ENCOUNTER — Emergency Department
Admission: EM | Admit: 2016-12-19 | Discharge: 2016-12-19 | Disposition: A | Discharge status: Home / Self Care | Payer: Self-pay | Attending: Emergency Medicine | Admitting: Emergency Medicine

## 2016-12-19 DIAGNOSIS — Z87891 Personal history of nicotine dependence: Secondary | ICD-10-CM | POA: Insufficient documentation

## 2016-12-19 DIAGNOSIS — Z5321 Procedure and treatment not carried out due to patient leaving prior to being seen by health care provider: Secondary | ICD-10-CM | POA: Insufficient documentation

## 2016-12-19 DIAGNOSIS — J45909 Unspecified asthma, uncomplicated: Secondary | ICD-10-CM | POA: Insufficient documentation

## 2016-12-19 DIAGNOSIS — R55 Syncope and collapse: Secondary | ICD-10-CM | POA: Insufficient documentation

## 2016-12-19 LAB — CBC
HEMATOCRIT: 40.3 % (ref 35.0–47.0)
HEMOGLOBIN: 13.9 g/dL (ref 12.0–16.0)
MCH: 28.7 pg (ref 26.0–34.0)
MCHC: 34.5 g/dL (ref 32.0–36.0)
MCV: 83.2 fL (ref 80.0–100.0)
Platelets: 184 10*3/uL (ref 150–440)
RBC: 4.84 MIL/uL (ref 3.80–5.20)
RDW: 12.9 % (ref 11.5–14.5)
WBC: 14.5 10*3/uL — ABNORMAL HIGH (ref 3.6–11.0)

## 2016-12-19 LAB — BASIC METABOLIC PANEL
ANION GAP: 9 (ref 5–15)
BUN: 5 mg/dL — ABNORMAL LOW (ref 6–20)
CALCIUM: 8.8 mg/dL — AB (ref 8.9–10.3)
CO2: 21 mmol/L — AB (ref 22–32)
Chloride: 105 mmol/L (ref 101–111)
Creatinine, Ser: 0.58 mg/dL (ref 0.44–1.00)
GFR calc Af Amer: >60 mL/min (ref >60)
GFR calc non Af Amer: >60 mL/min (ref >60)
GLUCOSE: 98 mg/dL (ref 65–99)
Potassium: 3.3 mmol/L — ABNORMAL LOW (ref 3.5–5.1)
Sodium: 135 mmol/L (ref 135–145)

## 2016-12-19 NOTE — ED Triage Notes (Signed)
Pt reports LOC around 1930 this evening. Pt states that she does "not know if she hit her head". Pt also reports recently being diagnosed with "sinuses". Pt does not having any hematoma or bruising to head and denies using anticoagulants. Pt is NAD at this time.

## 2016-12-19 NOTE — ED Notes (Signed)
Patient LWBS after triage.  Stated "I feel better and would like to go home".  Patient educated that it was not advised and that we recommend her to see the physician.  Patient made the decision to continue to leave without being seen by a physician.

## 2016-12-19 NOTE — Telephone Encounter (Signed)
Spoke with patient and she states that she mailed her zio monitor back over a month ago. Let her know that I would notify them of this. She was appreciative for the call and had no further questions.

## 2016-12-19 NOTE — Telephone Encounter (Signed)
No answer.Voicemail is full. °

## 2016-12-22 ENCOUNTER — Telehealth: Payer: Self-pay | Admitting: Emergency Medicine

## 2016-12-22 ENCOUNTER — Other Ambulatory Visit: Payer: Self-pay

## 2016-12-22 NOTE — Telephone Encounter (Signed)
Called patient due to lwot to inquire about condition and follow up plans. Says she has history of syncope.  Says she felt better, so she left.  I asked her to call pcp and let them know she came to ED and ask them to review her lab results.  She agrees.

## 2016-12-25 NOTE — Telephone Encounter (Addendum)
Spoke with patient and she states that she wore the monitor for 1 week and it broke her out. She wanted to know if there were any other monitors and let her know that the only one for that time frame is that one. Instructed her since they lost the other monitor we would like to get her in to place another one. She was agreeable to try this again and had no further questions at this time. Scheduled her to come in tomorrow to have another placed and will notify monitor company.

## 2016-12-25 NOTE — Telephone Encounter (Signed)
No answer/Voicemail is full. Attempting to call patient regarding missing monitor and need to reschedule for placement.

## 2016-12-25 NOTE — Addendum Note (Signed)
Addended by: Bryna Colander on: 12/25/2016 02:56 PM   Modules accepted: Orders

## 2016-12-26 ENCOUNTER — Ambulatory Visit: Payer: Self-pay

## 2016-12-26 DIAGNOSIS — R002 Palpitations: Secondary | ICD-10-CM

## 2016-12-30 ENCOUNTER — Ambulatory Visit: Payer: Self-pay | Admitting: Cardiology

## 2017-01-09 ENCOUNTER — Ambulatory Visit (INDEPENDENT_AMBULATORY_CARE_PROVIDER_SITE_OTHER): Payer: Self-pay

## 2017-01-09 ENCOUNTER — Other Ambulatory Visit: Payer: Self-pay

## 2017-01-09 DIAGNOSIS — R079 Chest pain, unspecified: Secondary | ICD-10-CM

## 2017-01-09 DIAGNOSIS — R0602 Shortness of breath: Secondary | ICD-10-CM

## 2017-01-09 DIAGNOSIS — R55 Syncope and collapse: Secondary | ICD-10-CM

## 2017-01-09 LAB — ECHOCARDIOGRAM STRESS TEST
CHL CUP MPHR: 199 {beats}/min
CSEPED: 9 min
CSEPEW: 10.7 METS
CSEPPHR: 176 {beats}/min
Exercise duration (sec): 21 s
Percent HR: 88 %
Rest HR: 70 {beats}/min

## 2017-01-13 ENCOUNTER — Ambulatory Visit (INDEPENDENT_AMBULATORY_CARE_PROVIDER_SITE_OTHER): Payer: Self-pay | Admitting: Cardiology

## 2017-01-13 ENCOUNTER — Encounter: Payer: Self-pay | Admitting: Cardiology

## 2017-01-13 VITALS — BP 100/72 | HR 81 | Ht 65.0 in | Wt 225.5 lb

## 2017-01-13 DIAGNOSIS — R55 Syncope and collapse: Secondary | ICD-10-CM

## 2017-01-13 DIAGNOSIS — R0602 Shortness of breath: Secondary | ICD-10-CM

## 2017-01-13 NOTE — Progress Notes (Signed)
Cardiology Office Note   Date:  01/13/2017   ID:  [name], DOB [birth year], MRN [id number]  Referring Doctor:  White, Arlyss Repress, NP   Cardiologist:   Almond Lint, MD   Reason for consultation:  Chief Complaint  Patient presents with  . other    Follow up from a stress echo from [facility]; chest pain & syncope.  Meds reviewed by the pt. verbally. Pt. had a spell of syncope this past Saturday.       History of Present Illness: Susan Swanson is a 21 y.o. female who presents fffup after tests  Review of records:She reports that she's had episodes of passing out since fifth grade. She does not recall any cardiac workup previously. Increased frequency in the last 2 months. More recent episodes of passing out are related to prolonged standing or sudden change in position, accompanied by pain in the epigastric area, sharp pain, and also symptoms of palpitations. She usually knows that she is about to pass out: She feels weak, her vision turns dim, and the room starts spinning. She can usually sit down to abort it.  Since last visit, she had a dizzy spell just this weekend. She reports she Started getting anxious over something, started hyperventilating, felt dizzy. No passing out. By the time she got to the ER, she felt better and decided to leave.   ROS:  Please see the history of present illness. Aside from mentioned under HPI, all other systems are reviewed and negative.    Past Medical History:  Diagnosis Date  . Anxiety   . Asthma   . Syncope     Past Surgical History:  Procedure Laterality Date  . CESAREAN SECTION       reports that she has quit smoking. Her smoking use included Cigarettes. She smoked 0.50 packs per day. She has never used smokeless tobacco. She reports that she drinks alcohol. She reports that she uses drugs.   family history is not on file.   Outpatient Medications Prior to Visit  Medication Sig Dispense Refill  . albuterol  (PROVENTIL HFA;VENTOLIN HFA) 108 (90 Base) MCG/ACT inhaler Inhale 2 puffs into the lungs every 2 (two) hours as needed. Inhale 2 puffs into the lungs every 2-4 hours as needed.    . etonogestrel (NEXPLANON) 68 MG IMPL implant Inject 68 mg into the skin once.    . gabapentin (NEURONTIN) 600 MG tablet Take 600 mg by mouth 3 (three) times daily.  99   No facility-administered medications prior to visit.      Allergies: Patient has no known allergies.    PHYSICAL EXAM: VS:  BP 100/72 (BP Location: Left Arm, Patient Position: Sitting, Cuff Size: Large)   Pulse 81   Ht 5\' 5"  (1.651 m)   Wt 225 lb 8 oz (102.3 kg)   BMI 37.53 kg/m  , Body mass index is 37.53 kg/m. Wt Readings from Last 3 Encounters:  01/13/17 225 lb 8 oz (102.3 kg)  12/19/16 220 lb (99.8 kg)  11/13/16 226 lb 8 oz (102.7 kg)    PHYSICAL EXAM: VS:  BP 100/72 (BP Location: Left Arm, Patient Position: Sitting, Cuff Size: Large)   Pulse 81   Ht 5\' 5"  (1.651 m)   Wt 225 lb 8 oz (102.3 kg)   BMI 37.53 kg/m  , BMI Body mass index is 37.53 kg/m. GENERAL:  well developed, well nourished, obese, not in acute distress HEENT: normocephalic, pink conjunctivae, anicteric sclerae, no  xanthelasma, normal dentition, oropharynx clear NECK:  no neck vein engorgement, JVP normal, no hepatojugular reflux, carotid upstroke brisk and symmetric, no bruit, no thyromegaly, no lymphadenopathy LUNGS:  good respiratory effort, clear to auscultation bilaterally CV:  PMI not displaced, no thrills, no lifts, S1 and S2 within normal limits, no palpable S3 or S4, no murmurs, no rubs, no gallops ABD:  Soft, nontender, nondistended, normoactive bowel sounds, no abdominal aortic bruit, no hepatomegaly, no splenomegaly MS: nontender back, no kyphosis, no scoliosis, no joint deformities EXT:  2+ DP/PT pulses, no edema, no varicosities, no cyanosis, no clubbing SKIN: warm, nondiaphoretic, normal turgor, no ulcers NEUROPSYCH: alert, oriented to person, place,  and time, sensory/motor grossly intact, normal mood, appropriate affect    Recent Labs: 12/19/2016: BUN 5; Creatinine, Ser 0.58; Hemoglobin 13.9; Platelets 184; Potassium 3.3; Sodium 135   Lipid Panel No results found for: CHOL, TRIG, HDL, CHOLHDL, VLDL, LDLCALC, LDLDIRECT   Other studies Reviewed:  EKG:  The ekg from 11/13/2016 was personally reviewed by me and it revealed sinus rhythm, 84 BPM.  Additional studies/ records that were reviewed personally reviewed by me today include:  Echo 01/09/2017: Study Conclusions  - Left ventricle: The cavity size was normal. Systolic function was   normal. The estimated ejection fraction was in the range of 60%   to 65%. Wall motion was normal; there were no regional wall   motion abnormalities. Left ventricular diastolic function   parameters were normal. - Left atrium: The atrium was normal in size. - Right ventricle: Systolic function was normal. - Pulmonary arteries: Systolic pressure was within the normal   range.  Stress echo 01/09/2017: Stress ECG conclusions: There were no stress arrhythmias or   conduction abnormalities. The stress ECG was negative for   ischemia. - Staged echo: Left ventricular ejection fraction was normal at   rest and with stress. There was no echocardiographic evidence for   stress-induced ischemia. - Peak stress: The estimated LV ejection fraction was 65-70%. No   evidence for new LV regional wall motion abnormalities. - Target heart rate achieved.  Impressions:  - Normal exercise stress echocardiogram study.   ASSESSMENT AND PLAN: Syncope Palpitations Chest pain Shortness breath  Episodes of syncope may have an element of orthostasis. Context is consistent with that, prolonged standing, sudden change in position. Recommend maneuvers to avoid orthostatic episodes. Consider compression stockings. Continue improving hydration. Hyperventilation can cause some of her symptoms as well. Recommend to  follow up with PCP to get this anxiety addressed/evaluated. If she starts hyperventilating, if possible patient should try breathing through her back to avoid hyperventilating.  Echo is unremarkable. Stress echo was low risk for ischemia.  Once we have the results of the monitor, we will inform patient of results. Patient verbalized understanding and agreed with plan.  Current medicines are reviewed at length with the patient today.  The patient does not have concerns regarding medicines.  Labs/ tests ordered today include:  No orders of the defined types were placed in this encounter.   I had a lengthy and detailed discussion with the patient regarding diagnoses, prognosis, diagnostic options, treatment options , and side effects of medications.   I counseled the patient on importance of lifestyle modification including heart healthy diet, regular physical activity .   Disposition:   FU with Cardiology after tests      Signed, Almond LintAileen Alexzandrea Normington, MD  01/13/2017 3:11 PM    [facility]  This note was generated in part  with voice recognition software and I apologize for any typographical errors that were not detected and corrected.

## 2017-01-13 NOTE — Patient Instructions (Signed)
Follow-Up: Your physician recommends that you schedule a follow-up appointment as needed. We will call you with results.   It was a pleasure seeing you today here in the office. Please do not hesitate to give us a call back if you have any further questions. [phone]  [place] CellarPamela A. RN, BSN

## 2017-02-04 ENCOUNTER — Telehealth: Payer: Self-pay | Admitting: Internal Medicine

## 2017-02-04 DIAGNOSIS — R55 Syncope and collapse: Secondary | ICD-10-CM

## 2017-02-04 NOTE — Telephone Encounter (Signed)
Order for monitor placed and registered for them to send to her address.

## 2017-02-04 NOTE — Telephone Encounter (Signed)
Spoke with patient at length about monitors and let her know that we would like to send her a preventice 30 day monitor so that we can get real time data. She was agreeable with this plan and stated that she would bring monitor back to our office when finished due to issues with monitor being lost. She was appreciative for the call and let her know that someone from the monitor company will be calling her to verify her mailing address. She verbalized understanding of our conversation, agreement with plan, and had no further questions at this time.

## 2017-02-11 ENCOUNTER — Ambulatory Visit (INDEPENDENT_AMBULATORY_CARE_PROVIDER_SITE_OTHER): Payer: Self-pay

## 2017-02-11 DIAGNOSIS — R55 Syncope and collapse: Secondary | ICD-10-CM

## 2017-03-20 ENCOUNTER — Telehealth: Payer: Self-pay | Admitting: *Deleted

## 2017-03-20 NOTE — Telephone Encounter (Signed)
error 

## 2017-04-17 ENCOUNTER — Emergency Department: Payer: Medicaid Other

## 2017-04-17 ENCOUNTER — Emergency Department
Admission: EM | Admit: 2017-04-17 | Discharge: 2017-04-17 | Disposition: A | Discharge status: Home / Self Care | Payer: Medicaid Other | Attending: Emergency Medicine | Admitting: Emergency Medicine

## 2017-04-17 ENCOUNTER — Encounter: Payer: Self-pay | Admitting: Emergency Medicine

## 2017-04-17 DIAGNOSIS — Z87891 Personal history of nicotine dependence: Secondary | ICD-10-CM | POA: Insufficient documentation

## 2017-04-17 DIAGNOSIS — J45909 Unspecified asthma, uncomplicated: Secondary | ICD-10-CM | POA: Insufficient documentation

## 2017-04-17 DIAGNOSIS — Z79899 Other long term (current) drug therapy: Secondary | ICD-10-CM | POA: Insufficient documentation

## 2017-04-17 DIAGNOSIS — R55 Syncope and collapse: Secondary | ICD-10-CM | POA: Insufficient documentation

## 2017-04-17 LAB — BASIC METABOLIC PANEL
ANION GAP: 8 (ref 5–15)
BUN: 13 mg/dL (ref 6–20)
CO2: 25 mmol/L (ref 22–32)
Calcium: 9.3 mg/dL (ref 8.9–10.3)
Chloride: 106 mmol/L (ref 101–111)
Creatinine, Ser: 0.88 mg/dL (ref 0.44–1.00)
GFR calc Af Amer: >60 mL/min (ref >60)
GFR calc non Af Amer: >60 mL/min (ref >60)
GLUCOSE: 92 mg/dL (ref 65–99)
POTASSIUM: 3.5 mmol/L (ref 3.5–5.1)
Sodium: 139 mmol/L (ref 135–145)

## 2017-04-17 LAB — URINE DRUG SCREEN, QUALITATIVE (ARMC ONLY)
AMPHETAMINES, UR SCREEN: NOT DETECTED
Barbiturates, Ur Screen: NOT DETECTED
Benzodiazepine, Ur Scrn: NOT DETECTED
CANNABINOID 50 NG, UR ~~LOC~~: POSITIVE — AB
Cocaine Metabolite,Ur ~~LOC~~: NOT DETECTED
MDMA (ECSTASY) UR SCREEN: NOT DETECTED
Methadone Scn, Ur: NOT DETECTED
Opiate, Ur Screen: NOT DETECTED
Phencyclidine (PCP) Ur S: NOT DETECTED
TRICYCLIC, UR SCREEN: NOT DETECTED

## 2017-04-17 LAB — URINALYSIS, COMPLETE (UACMP) WITH MICROSCOPIC
Bilirubin Urine: NEGATIVE
Glucose, UA: NEGATIVE mg/dL
Hgb urine dipstick: NEGATIVE
Ketones, ur: NEGATIVE mg/dL
Leukocytes, UA: NEGATIVE
Nitrite: NEGATIVE
Protein, ur: NEGATIVE mg/dL
SPECIFIC GRAVITY, URINE: 1.02 (ref 1.005–1.030)
pH: 7 (ref 5.0–8.0)

## 2017-04-17 LAB — CBC
HEMATOCRIT: 42.8 % (ref 35.0–47.0)
HEMOGLOBIN: 14.7 g/dL (ref 12.0–16.0)
MCH: 29.6 pg (ref 26.0–34.0)
MCHC: 34.2 g/dL (ref 32.0–36.0)
MCV: 86.5 fL (ref 80.0–100.0)
Platelets: 227 10*3/uL (ref 150–440)
RBC: 4.95 MIL/uL (ref 3.80–5.20)
RDW: 12.7 % (ref 11.5–14.5)
WBC: 9.5 10*3/uL (ref 3.6–11.0)

## 2017-04-17 LAB — PREGNANCY, URINE: PREG TEST UR: NEGATIVE

## 2017-04-17 NOTE — ED Triage Notes (Signed)
First Nurse Note:  Arrives with c/o syncope at work today.  Patient is AAOx3.  Skin warm and dry. NAD.  Ambulates with easy and steady gait.

## 2017-04-17 NOTE — ED Provider Notes (Signed)
[facility] Emergency Department Provider Note   ____________________________________________   First MD Initiated Contact with Patient 04/17/17 2011     (approximate)  I have reviewed the triage vital signs and the nursing notes.   HISTORY  Chief Complaint Near Syncope    HPI Susan Swanson is a 21 y.o. female who comes in having passed out today at work after feeling very hot. She reports she just got hot and then passed out. She thinks she hit her head she has some pain in the back of her head in the bottom of her neck hurts she says. Patient has a history of repeated episodes of syncope and has had an event recorder and a echo both of which were negative she also can stress test. This was also negative. Aside from the pain in her head and neck she feels well now.   Past Medical History:  Diagnosis Date  . Anxiety   . Asthma   . Syncope     There are no active problems to display for this patient.   Past Surgical History:  Procedure Laterality Date  . CESAREAN SECTION      Prior to Admission medications   Medication Sig Start Date End Date Taking? Authorizing Provider  albuterol (PROVENTIL HFA;VENTOLIN HFA) 108 (90 Base) MCG/ACT inhaler Inhale 2 puffs into the lungs every 2 (two) hours as needed. Inhale 2 puffs into the lungs every 2-4 hours as needed. 06/18/16   [provider]  etonogestrel (NEXPLANON) 68 MG IMPL implant Inject 68 mg into the skin once.    [provider]    Allergies Patient has no known allergies.  No family history on file.  Social History Social History  Substance Use Topics  . Smoking status: Former Smoker    Packs/day: 0.50    Types: Cigarettes  . Smokeless tobacco: Never Used  . Alcohol use Yes     Comment: occassionally    Review of Systems  Constitutional: No fever/chills Eyes: No visual changes. ENT: No sore throat. Cardiovascular: Denies chest pain. Respiratory: Denies  shortness of breath. Gastrointestinal: No abdominal pain.  No nausea, no vomiting.  No diarrhea.  No constipation. Genitourinary: Negative for dysuria. Musculoskeletal: Negative for back pain. Skin: Negative for rash. Neurological: Negative for  focal weakness or numbness.   ____________________________________________   PHYSICAL EXAM:  VITAL SIGNS: ED Triage Vitals  Enc Vitals Group     BP 04/17/17 1849 119/71     Pulse Rate 04/17/17 1849 92     Resp 04/17/17 1849 18     Temp 04/17/17 1849 97.6 F (36.4 C)     Temp Source 04/17/17 1849 Oral     SpO2 04/17/17 1849 100 %     Weight 04/17/17 1848 220 lb (99.8 kg)     Height 04/17/17 1848 5\' 5"  (1.651 m)     Head Circumference --      Peak Flow --      Pain Score 04/17/17 1848 10     Pain Loc --      Pain Edu? --      Excl. in GC? --     Constitutional: Alert and oriented. Well appearing and in no acute distress. Eyes: Conjunctivae are normal. Head: AtraumaticAlthough she is tender over the occiput. Nose: No congestion/rhinnorhea. Mouth/Throat: Mucous membranes are moist.  Oropharynx non-erythematous. Neck: No stridor. cervical spine tenderness to palpation. At approximately C7 Cardiovascular: Normal rate, regular rhythm. Grossly normal heart sounds.  Good  peripheral circulation. Respiratory: Normal respiratory effort.  No retractions. Lungs CTAB. Gastrointestinal: Soft and nontender. No distention. No abdominal bruits. No CVA tenderness. Musculoskeletal: No lower extremity tenderness nor edema.  No joint effusions. Neurologic:  Normal speech and language. No gross focal neurologic deficits are appreciated. Specifically cranial nerves II through XII are intact on the visual fields are not checked motor is 5 over 5 throughout and sensation is intact throughout. Skin:  Skin is warm, dry and intact. No rash noted. Psychiatric: Mood and affect are normal. Speech and behavior are  normal.  ____________________________________________   LABS (all labs ordered are listed, but only abnormal results are displayed)  Labs Reviewed  URINALYSIS, COMPLETE (UACMP) WITH MICROSCOPIC - Abnormal; Notable for the following:       Result Value   Color, Urine YELLOW (*)    APPearance CLEAR (*)    Bacteria, UA RARE (*)    Squamous Epithelial / LPF 0-5 (*)    All other components within normal limits  URINE DRUG SCREEN, QUALITATIVE ([facility] ONLY) - Abnormal; Notable for the following:    Cannabinoid 50 Ng, Ur [place] POSITIVE (*)    All other components within normal limits  BASIC METABOLIC PANEL  CBC  PREGNANCY, URINE  CBG MONITORING, ED   ____________________________________________  EKG EKG read and interpreted by me shows normal sinus rhythm rate of 88 normal axis computer is reading possible left atrial enlargement otherwise no acute changes  ____________________________________________  RADIOLOGY  IMPRESSION: 1. No acute intracranial abnormality or displaced calvarial fracture. Unremarkable CT of the head. 2. No acute fracture or dislocation of the cervical spine. Unremarkable CT of the cervical spine.   Electronically Signed   By: Mitzi Hansen M.D.   On: 04/17/2017 21:31 ____________________________________________   PROCEDURES  Procedure(s) performed:   Procedures  Critical Care performed:  ____________________________________________   INITIAL IMPRESSION / ASSESSMENT AND PLAN / ED COURSE  Pertinent labs & imaging results that were available during my care of the patient were reviewed by me and considered in my medical decision making (see chart for details).        ____________________________________________   FINAL CLINICAL IMPRESSION(S) / ED DIAGNOSES  Final diagnoses:  Syncope and collapse      NEW MEDICATIONS STARTED DURING THIS VISIT:  New Prescriptions   No medications on file     Note:  This document was  prepared using Dragon voice recognition software and may include unintentional dictation errors.    Arnaldo Natal, MD 04/17/17 [phone]

## 2017-04-17 NOTE — Discharge Instructions (Signed)
Please follow-up with your doctor. Return for any further problems.

## 2017-04-17 NOTE — ED Triage Notes (Signed)
States she became really hot while at work  And passed out  Having pain to head and neck

## 2017-04-25 ENCOUNTER — Encounter: Payer: Self-pay | Admitting: *Deleted

## 2017-04-25 ENCOUNTER — Ambulatory Visit
Admission: EM | Admit: 2017-04-25 | Discharge: 2017-04-25 | Disposition: A | Discharge status: Home / Self Care | Payer: Self-pay | Attending: Family Medicine | Admitting: Family Medicine

## 2017-04-25 DIAGNOSIS — Z79899 Other long term (current) drug therapy: Secondary | ICD-10-CM | POA: Insufficient documentation

## 2017-04-25 DIAGNOSIS — J039 Acute tonsillitis, unspecified: Secondary | ICD-10-CM

## 2017-04-25 DIAGNOSIS — J45909 Unspecified asthma, uncomplicated: Secondary | ICD-10-CM | POA: Insufficient documentation

## 2017-04-25 DIAGNOSIS — J029 Acute pharyngitis, unspecified: Secondary | ICD-10-CM

## 2017-04-25 DIAGNOSIS — F419 Anxiety disorder, unspecified: Secondary | ICD-10-CM | POA: Insufficient documentation

## 2017-04-25 DIAGNOSIS — Z87891 Personal history of nicotine dependence: Secondary | ICD-10-CM | POA: Insufficient documentation

## 2017-04-25 LAB — MONONUCLEOSIS SCREEN: Mono Screen: NEGATIVE

## 2017-04-25 LAB — RAPID STREP SCREEN (MED CTR MEBANE ONLY): Streptococcus, Group A Screen (Direct): NEGATIVE

## 2017-04-25 MED ORDER — LIDOCAINE VISCOUS 2 % MT SOLN: OROMUCOSAL | 0 refills | Status: DC

## 2017-04-25 MED ORDER — AMOXICILLIN 875 MG PO TABS: 875.0000 mg | ORAL_TABLET | Freq: Two times a day (BID) | ORAL | 0 refills | Status: DC

## 2017-04-25 NOTE — ED Provider Notes (Signed)
[facility]    CSN: [id number] Arrival date & time: 04/25/17  1107     History   Chief Complaint No chief complaint on file.   HPI Susan Swanson is a 21 y.o. female.   21 yo female with a c/o sore throat since yesterday, associated with pain when swallowing and chills yesterday.  Denies any nasal congestion, fevers, cough, wheezing, chest pains, shortness of breath.    The history is provided by the patient.    Past Medical History:  Diagnosis Date  . Anxiety   . Asthma   . Syncope     There are no active problems to display for this patient.   Past Surgical History:  Procedure Laterality Date  . CESAREAN SECTION      OB History    No data available       Home Medications    Prior to Admission medications   Medication Sig Start Date End Date Taking? Authorizing Provider  albuterol (PROVENTIL HFA;VENTOLIN HFA) 108 (90 Base) MCG/ACT inhaler Inhale 2 puffs into the lungs every 2 (two) hours as needed. Inhale 2 puffs into the lungs every 2-4 hours as needed. 06/18/16  Yes [provider]  etonogestrel (NEXPLANON) 68 MG IMPL implant Inject 68 mg into the skin once.   Yes [provider]  amoxicillin (AMOXIL) 875 MG tablet Take 1 tablet (875 mg total) by mouth 2 (two) times daily. 04/25/17   Payton Mccallum, MD  lidocaine (XYLOCAINE) 2 % solution 20 ml gargle and spit q 6 hours prn 04/25/17   Payton Mccallum, MD    Family History History reviewed. No pertinent family history.  Social History Social History  Substance Use Topics  . Smoking status: Former Smoker    Packs/day: 0.50    Types: Cigarettes  . Smokeless tobacco: Never Used  . Alcohol use Yes     Comment: occassionally     Allergies   Patient has no known allergies.   Review of Systems Review of Systems   Physical Exam Triage Vital Signs ED Triage Vitals  Enc Vitals Group     BP 04/25/17 1117 104/73     Pulse Rate 04/25/17 1117 100     Resp 04/25/17  1117 16     Temp 04/25/17 1117 98.6 F (37 C)     Temp Source 04/25/17 1117 Oral     SpO2 04/25/17 1117 97 %     Weight --      Height --      Head Circumference --      Peak Flow --      Pain Score 04/25/17 1119 9     Pain Loc --      Pain Edu? --      Excl. in GC? --    No data found.   Updated Vital Signs BP 104/73 (BP Location: Left Arm)   Pulse 100   Temp 98.6 F (37 C) (Oral)   Resp 16   SpO2 97%   Visual Acuity Right Eye Distance:   Left Eye Distance:   Bilateral Distance:    Right Eye Near:   Left Eye Near:    Bilateral Near:     Physical Exam  Constitutional: She appears well-developed and well-nourished. No distress.  HENT:  Head: Normocephalic and atraumatic.  Right Ear: Tympanic membrane, external ear and ear canal normal.  Left Ear: Tympanic membrane, external ear and ear canal normal.  Nose: No mucosal edema, rhinorrhea, nose lacerations, sinus  tenderness, nasal deformity, septal deviation or nasal septal hematoma. No epistaxis.  No foreign bodies. Right sinus exhibits no maxillary sinus tenderness and no frontal sinus tenderness. Left sinus exhibits no maxillary sinus tenderness and no frontal sinus tenderness.  Mouth/Throat: Uvula is midline and mucous membranes are normal. Posterior oropharyngeal erythema present. No oropharyngeal exudate, posterior oropharyngeal edema or tonsillar abscesses. Tonsillar exudate.  Neck: Normal range of motion. Neck supple. No thyromegaly present.  Cardiovascular: Normal rate.   Pulmonary/Chest: Effort normal. No respiratory distress.  Lymphadenopathy:    She has no cervical adenopathy.  Skin: She is not diaphoretic.  Nursing note and vitals reviewed.    UC Treatments / Results  Labs (all labs ordered are listed, but only abnormal results are displayed) Labs Reviewed  RAPID STREP SCREEN (NOT AT [facility])  CULTURE, GROUP A STREP [facility])  MONONUCLEOSIS SCREEN    EKG  EKG Interpretation None        Radiology No results found.  Procedures Procedures (including critical care time)  Medications Ordered in UC Medications - No data to display   Initial Impression / Assessment and Plan / UC Course  I have reviewed the triage vital signs and the nursing notes.  Pertinent labs & imaging results that were available during my care of the patient were reviewed by me and considered in my medical decision making (see chart for details).       Final Clinical Impressions(s) / UC Diagnoses   Final diagnoses:  Sore throat  Tonsillitis    New Prescriptions New Prescriptions   AMOXICILLIN (AMOXIL) 875 MG TABLET    Take 1 tablet (875 mg total) by mouth 2 (two) times daily.   LIDOCAINE (XYLOCAINE) 2 % SOLUTION    20 ml gargle and spit q 6 hours prn   1. Lab results and diagnosis reviewed with patient 2. rx as per orders above; reviewed possible side effects, interactions, risks and benefits  3. Recommend supportive treatment with otc analgesics, salt water gargles  4. Follow-up prn if symptoms worsen or don't improve  Controlled Substance Prescriptions [place] Controlled Substance Registry consulted? Not Applicable   Payton Mccallum, MD 04/25/17 1209

## 2017-04-25 NOTE — ED Triage Notes (Signed)
Patient started having sore throat and throat swelling yesterday. Patient reports a diagnosis of syncope.

## 2017-04-28 LAB — CULTURE, GROUP A STREP (THRC)

## 2017-06-16 DIAGNOSIS — Z915 Personal history of self-harm: Secondary | ICD-10-CM | POA: Insufficient documentation

## 2017-06-16 DIAGNOSIS — IMO0002 Reserved for concepts with insufficient information to code with codable children: Secondary | ICD-10-CM | POA: Insufficient documentation

## 2017-06-16 DIAGNOSIS — Z8709 Personal history of other diseases of the respiratory system: Secondary | ICD-10-CM | POA: Insufficient documentation

## 2017-06-16 LAB — HM PAP SMEAR: HM Pap smear: NEGATIVE

## 2017-09-23 LAB — HIV ANTIBODY (ROUTINE TESTING W REFLEX): HIV: NONREACTIVE

## 2017-10-20 ENCOUNTER — Encounter: Payer: Self-pay | Admitting: Emergency Medicine

## 2017-10-20 ENCOUNTER — Other Ambulatory Visit: Payer: Self-pay

## 2017-10-20 ENCOUNTER — Ambulatory Visit
Admission: EM | Admit: 2017-10-20 | Discharge: 2017-10-20 | Disposition: A | Discharge status: Home / Self Care | Payer: Self-pay

## 2017-10-20 DIAGNOSIS — J0140 Acute pansinusitis, unspecified: Secondary | ICD-10-CM

## 2017-10-20 DIAGNOSIS — J209 Acute bronchitis, unspecified: Secondary | ICD-10-CM

## 2017-10-20 DIAGNOSIS — R062 Wheezing: Secondary | ICD-10-CM

## 2017-10-20 MED ORDER — IPRATROPIUM-ALBUTEROL 0.5-2.5 (3) MG/3ML IN SOLN
3.0000 mL | Freq: Once | RESPIRATORY_TRACT | Status: AC
  Administered 2017-10-20: 3 mL via RESPIRATORY_TRACT

## 2017-10-20 MED ORDER — PREDNISONE 20 MG PO TABS: 40.0000 mg | ORAL_TABLET | Freq: Every day | ORAL | 0 refills | Status: AC

## 2017-10-20 MED ORDER — METHYLPREDNISOLONE SODIUM SUCC 40 MG IJ SOLR
80.0000 mg | Freq: Once | INTRAMUSCULAR | Status: AC
  Administered 2017-10-20: 80 mg via INTRAMUSCULAR

## 2017-10-20 MED ORDER — AMOXICILLIN-POT CLAVULANATE 875-125 MG PO TABS: 1.0000 | ORAL_TABLET | Freq: Two times a day (BID) | ORAL | 0 refills | Status: AC

## 2017-10-20 MED ORDER — HYDROCOD POLST-CPM POLST ER 10-8 MG/5ML PO SUER: 5.0000 mL | Freq: Two times a day (BID) | ORAL | 0 refills | Status: DC | PRN

## 2017-10-20 NOTE — ED Triage Notes (Signed)
Patient in today c/o 1 month history of cough, chest and nasal congestion. Patient hasn't taken temperature, but has had chills. Patient has tried OTC Robitussin, Dayquil and Mucinex without relief.

## 2017-10-20 NOTE — Discharge Instructions (Addendum)
Recommend start Augmentin 875mg  twice a day as directed. Increase fluid intake to help loosen up mucus. Recommend start Prednisone 40mg  daily for 5 days. May take Tussionex cough syrup 1 teaspoon every 12 hours as needed for cough. May continue Albuterol inhaler or nebulizer at home every 6 hours as needed for wheezing. Follow-up with your PCP in 4 to 5 days if not improving.

## 2017-10-20 NOTE — ED Provider Notes (Signed)
[facility]    CSN: [id number] Arrival date & time: 10/20/17  1130     History   Chief Complaint Chief Complaint  Patient presents with  . Cough    HPI Susan Swanson is a 22 y.o. female.   22 year old female presents with nasal congestion for over 1 month. Then developed a cough that was intermittent for the past few weeks but has become more severe in the past 2 days. Also developed chills and has vomited due to the cough. Also unable to sleep at night due to cough. Has tried Robitussin, Dayquil, and Mucinex with no relief. Also has used Albuterol inhaler and her daughter's nebulizer machine the past 2 days with minimal relief. Does have a history of asthma and does smoke cigarettes daily. Patient also has history of anxiety and syncope and unable to sit on the exam table for extended periods because "dangling her feet causes syncope". Otherwise no other chronic health issues and only chronic medication is DepoProvera.    The history is provided by the patient.    Past Medical History:  Diagnosis Date  . Anxiety   . Asthma   . Syncope     There are no active problems to display for this patient.   Past Surgical History:  Procedure Laterality Date  . CESAREAN SECTION      OB History    No data available       Home Medications    Prior to Admission medications   Medication Sig Start Date End Date Taking? Authorizing Provider  albuterol (PROVENTIL HFA;VENTOLIN HFA) 108 (90 Base) MCG/ACT inhaler Inhale 2 puffs into the lungs every 2 (two) hours as needed. Inhale 2 puffs into the lungs every 2-4 hours as needed. 06/18/16  Yes [provider]  medroxyPROGESTERone (DEPO-PROVERA) 150 MG/ML injection Inject 150 mg into the muscle every 3 (three) months.   Yes [provider]  amoxicillin-clavulanate (AUGMENTIN) 875-125 MG tablet Take 1 tablet by mouth every 12 (twelve) hours for 7 days. 10/20/17 10/27/17  Sudie GrumblingAmyot, Kalvyn Desa Berry, NP    chlorpheniramine-HYDROcodone (TUSSIONEX PENNKINETIC ER) 10-8 MG/5ML SUER Take 5 mLs by mouth every 12 (twelve) hours as needed for cough. 10/20/17   Sudie GrumblingAmyot, Adlynn Lowenstein Berry, NP  predniSONE (DELTASONE) 20 MG tablet Take 2 tablets (40 mg total) by mouth daily for 5 days. 10/20/17 10/25/17  Sudie GrumblingAmyot, Irasema Chalk Berry, NP    Family History Family History  Problem Relation Age of Onset  . Osteoarthritis Mother   . Depression Mother     Social History Social History   Tobacco Use  . Smoking status: Former Smoker    Packs/day: 0.50    Types: Cigarettes  . Smokeless tobacco: Never Used  Substance Use Topics  . Alcohol use: Yes    Comment: occassionally  . Drug use: Yes    Comment: pt states she has a hx of drug addiciton; pt states she used any and everything.      Allergies   Patient has no known allergies.   Review of Systems Review of Systems  Constitutional: Positive for activity change, appetite change, chills and fatigue. Negative for fever.  HENT: Positive for congestion, postnasal drip, rhinorrhea, sinus pressure, sinus pain and sore throat. Negative for ear discharge, ear pain, facial swelling, mouth sores, nosebleeds, sneezing and trouble swallowing.   Eyes: Negative for pain, discharge, redness and itching.  Respiratory: Positive for cough, chest tightness, shortness of breath and wheezing.   Cardiovascular: Negative for chest pain  and palpitations.  Gastrointestinal: Positive for nausea and vomiting. Negative for abdominal pain and diarrhea.  Musculoskeletal: Positive for arthralgias and myalgias. Negative for back pain, neck pain and neck stiffness.  Skin: Negative for rash and wound.  Neurological: Positive for light-headedness and headaches. Negative for dizziness, tremors, seizures, syncope, weakness and numbness.  Hematological: Negative for adenopathy. Does not bruise/bleed easily.     Physical Exam Triage Vital Signs ED Triage Vitals [10/20/17 1200]  Enc Vitals Group     BP  110/60     Pulse Rate 84     Resp 20     Temp (!) 97.5 F (36.4 C)     Temp Source Oral     SpO2 100 %     Weight 270 lb (122.5 kg)     Height 5\' 4"  (1.626 m)     Head Circumference      Peak Flow      Pain Score 0     Pain Loc      Pain Edu?      Excl. in GC?    No data found.  Updated Vital Signs BP 110/60 (BP Location: Left Arm)   Pulse 84   Temp (!) 97.5 F (36.4 C) (Oral)   Resp 20   Ht 5\' 4"  (1.626 m)   Wt 270 lb (122.5 kg)   SpO2 100%   BMI 46.35 kg/m   Visual Acuity Right Eye Distance:   Left Eye Distance:   Bilateral Distance:    Right Eye Near:   Left Eye Near:    Bilateral Near:     Physical Exam  Constitutional: She is oriented to person, place, and time. She appears well-developed and well-nourished. She appears lethargic. She appears ill. No distress.  HENT:  Head: Normocephalic and atraumatic.  Right Ear: Hearing, tympanic membrane, external ear and ear canal normal.  Left Ear: Hearing, tympanic membrane, external ear and ear canal normal.  Nose: Mucosal edema and rhinorrhea present. Right sinus exhibits maxillary sinus tenderness and frontal sinus tenderness. Left sinus exhibits maxillary sinus tenderness and frontal sinus tenderness.  Mouth/Throat: Uvula is midline and mucous membranes are normal. Posterior oropharyngeal erythema present.  Eyes: Conjunctivae and EOM are normal.  Neck: Normal range of motion. Neck supple.  Cardiovascular: Normal rate, regular rhythm and normal heart sounds.  No murmur heard. Pulmonary/Chest: Effort normal. No accessory muscle usage or stridor. Tachypnea noted. No respiratory distress. She has decreased breath sounds in the right upper field, the right lower field, the left upper field and the left lower field. She has wheezes in the right upper field, the right lower field, the left upper field and the left lower field. She has rhonchi in the right upper field and the left upper field. She has no rales.    Musculoskeletal: Normal range of motion.  Lymphadenopathy:    She has no cervical adenopathy.  Neurological: She is oriented to person, place, and time. She has normal strength. She appears lethargic.  Skin: Skin is warm and dry. Capillary refill takes less than 2 seconds. No rash noted.  Psychiatric: Her speech is normal. Judgment and thought content normal. She is slowed. Cognition and memory are normal.     UC Treatments / Results  Labs (all labs ordered are listed, but only abnormal results are displayed) Labs Reviewed - No data to display  EKG  EKG Interpretation None       Radiology No results found.  Procedures Procedures (including critical care time)  Medications Ordered in UC Medications  ipratropium-albuterol (DUONEB) 0.5-2.5 (3) MG/3ML nebulizer solution 3 mL (3 mLs Nebulization Given 10/20/17 1306)  methylPREDNISolone sodium succinate (SOLU-MEDROL) 40 mg/mL injection 80 mg (80 mg Intramuscular Given 10/20/17 1304)     Initial Impression / Assessment and Plan / UC Course  I have reviewed the triage vital signs and the nursing notes.  Pertinent labs & imaging results that were available during my care of the patient were reviewed by me and considered in my medical decision making (see chart for details).    Due to wheezing and inflammation, gave DuoNeb and SoluMedrol 80mg  IM now. After treatment, less wheezing present in all lung fields and patient indicated that she could breath better. Discussed that she has a sinus infection with bronchitis. Recommend start Augmentin 875mg  twice a day as directed. Increase fluid intake to help loosen up mucus. Recommend start Prednisone 40mg  daily for 5 days. May take Tussionex cough syrup 1 teaspoon every 12 hours as needed for cough. May continue Albuterol inhaler or nebulizer at home every 6 hours as needed for wheezing. Follow-up with her PCP in 4 to 5 days if not improving or go to the ER if symptoms worsen.     Final  Clinical Impressions(s) / UC Diagnoses   Final diagnoses:  Acute non-recurrent pansinusitis  Acute bronchitis, unspecified organism  Wheezing    ED Discharge Orders        Ordered    amoxicillin-clavulanate (AUGMENTIN) 875-125 MG tablet  Every 12 hours     10/20/17 1327    predniSONE (DELTASONE) 20 MG tablet  Daily     10/20/17 1327    chlorpheniramine-HYDROcodone (TUSSIONEX PENNKINETIC ER) 10-8 MG/5ML SUER  Every 12 hours PRN     10/20/17 1328       Controlled Substance Prescriptions [place] Controlled Substance Registry consulted? Yes, I have consulted the [place] Controlled Substances Registry for this patient, and feel the risk/benefit ratio today is favorable for proceeding with this prescription for a controlled substance. Last active Rx was for hydrocodone cough syrup in April 2018. I feel that she is a suitable candidate for cough syrup with codeine today.    Sudie Grumbling, NP 10/21/17 1010

## 2017-11-08 IMAGING — CT CT CERVICAL SPINE W/O CM
3 series · 14 of 33 positions shown, 17 images · non-contrast
Comparison: CT head dated 11/01/2016. CT cervical spine dated
08/01/2012.

CLINICAL DATA: 21 y/o F; syncope with pain of the posterior head
and neck. Initial exam.

EXAM:
CT HEAD WITHOUT CONTRAST
CT CERVICAL SPINE WITHOUT CONTRAST
TECHNIQUE: Multidetector CT imaging of the head and cervical spine was
performed following the standard protocol without intravenous
contrast. Multiplanar CT image reconstructions of the cervical spine
were also generated.

[Series 2: head wo · axial · 0.41mm/px · z∈[-121,-21]mm · 6 of 27 slices shown, 8 images]
[im 5/27  soft-tissue]
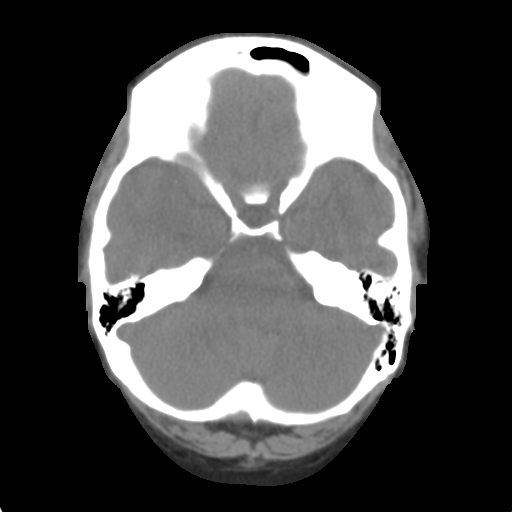
[im 5/27  bone]
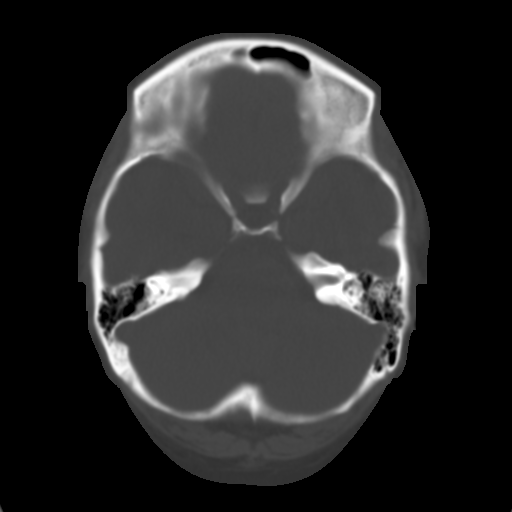
[im 9/27  bone]
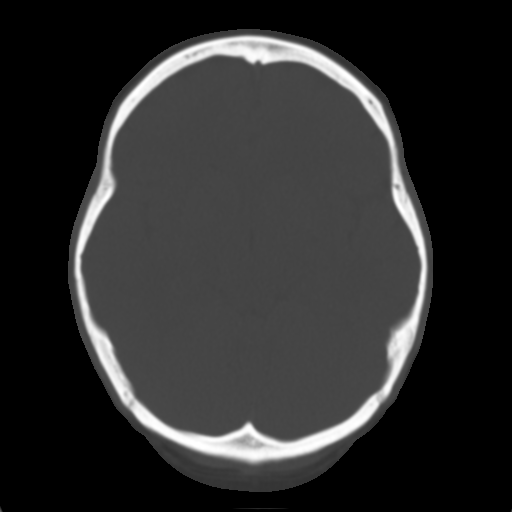
[im 13/27  bone]
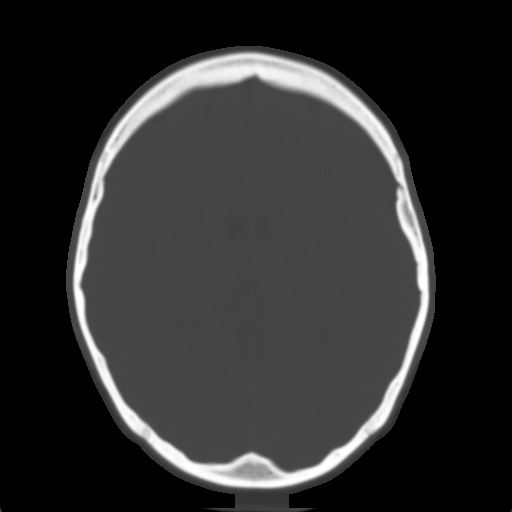
[im 17/27  bone]
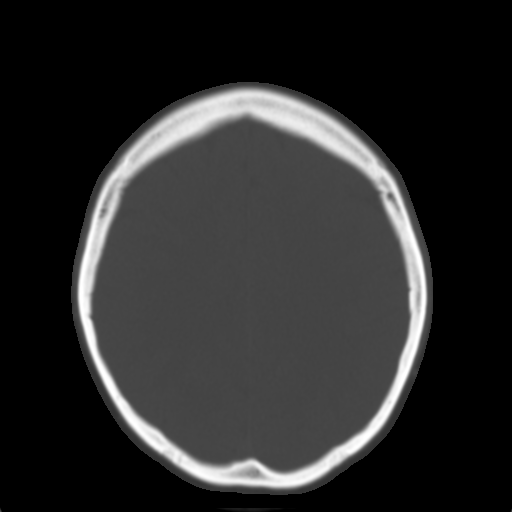
[im 21/27  soft-tissue]
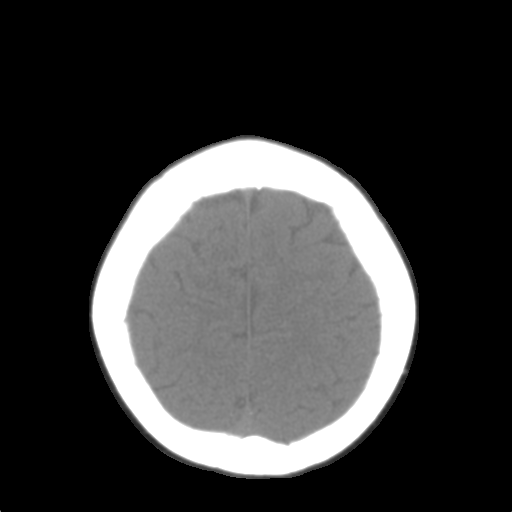
[im 21/27  bone]
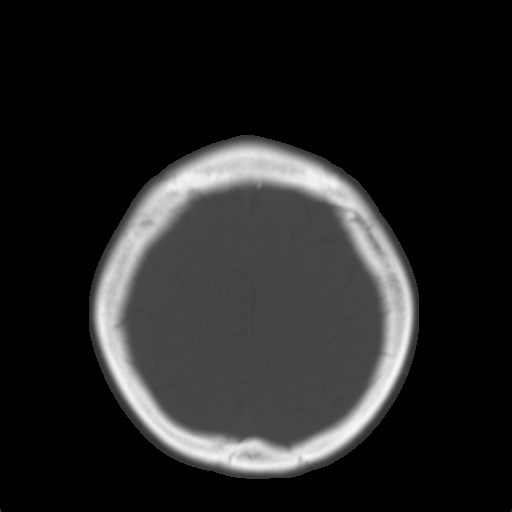
[im 25/27  bone]
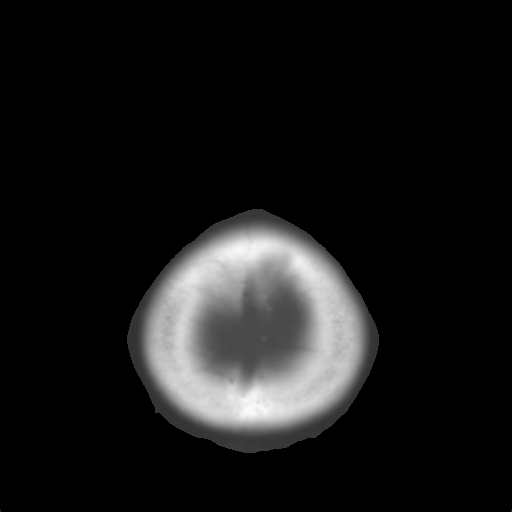

[Series 4: coronal soft tissue · coronal · 0.27mm/px · 3 of 60 slices shown]
[im 12/60  bone]
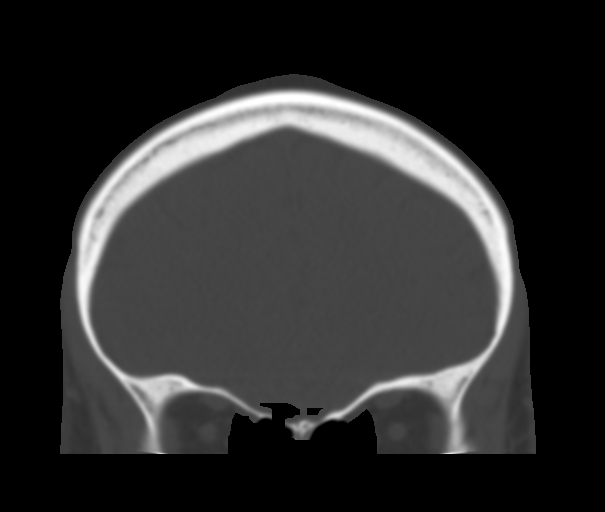
[im 24/60  bone]
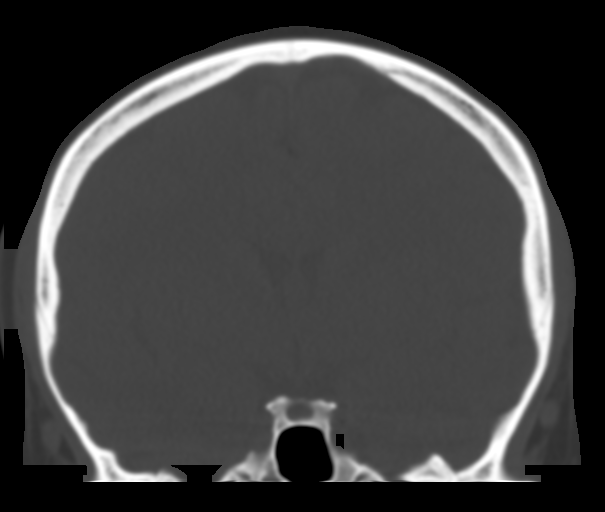
[im 36/60  bone]
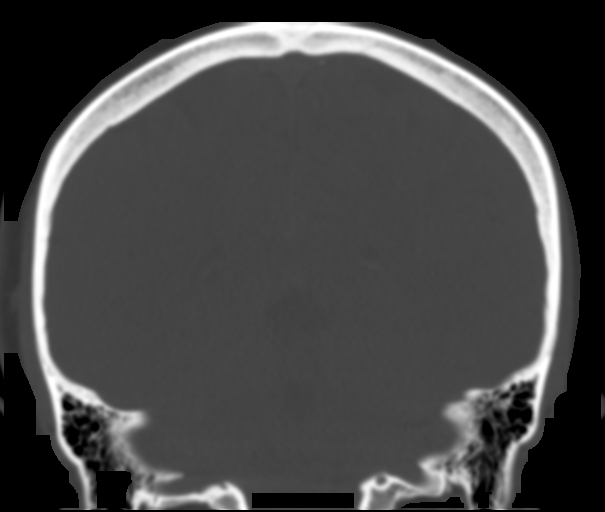

[Series 5: sagittal soft tissue · sagittal · 0.28mm/px · 5 of 57 slices shown, 6 images]
[im 19/57  bone]
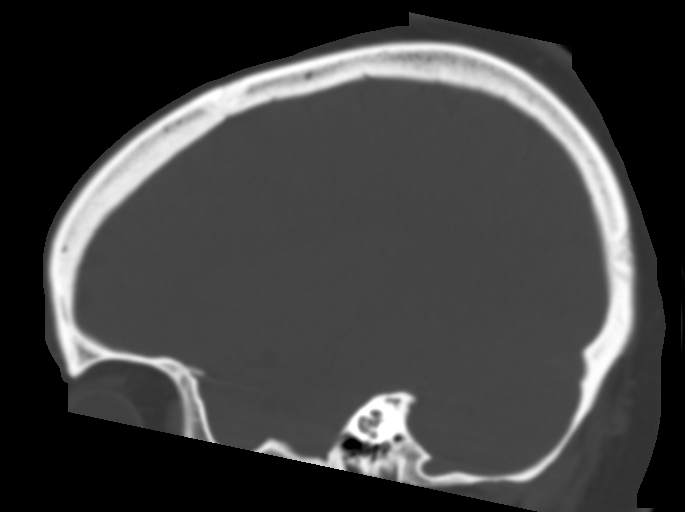
[im 24/57  bone]
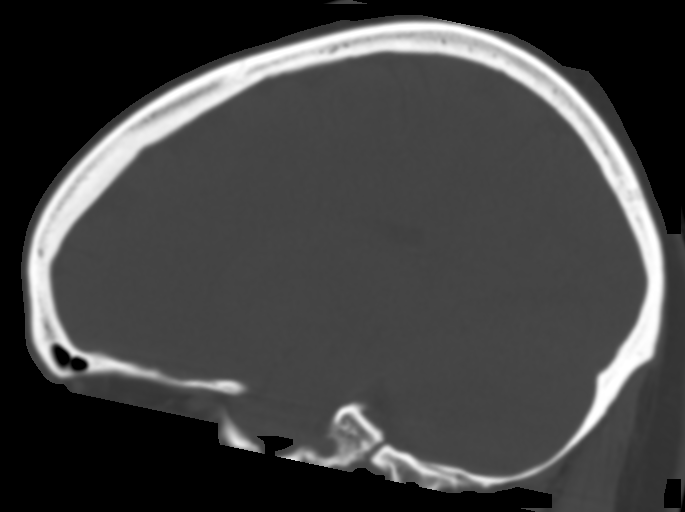
[im 29/57  soft-tissue]
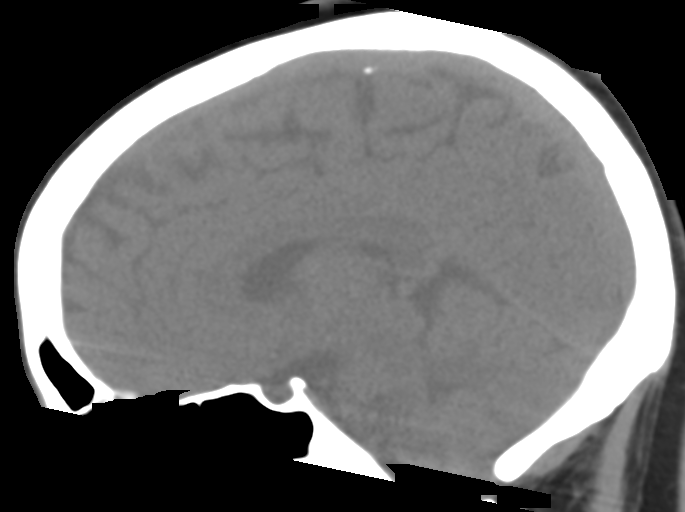
[im 29/57  bone]
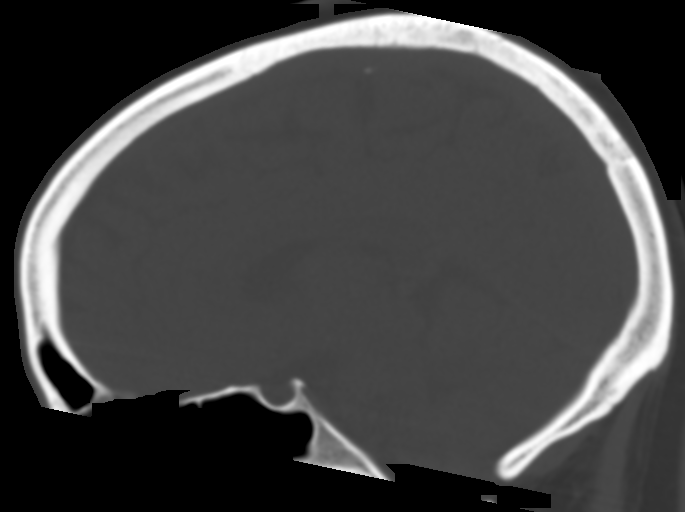
[im 33/57  bone]
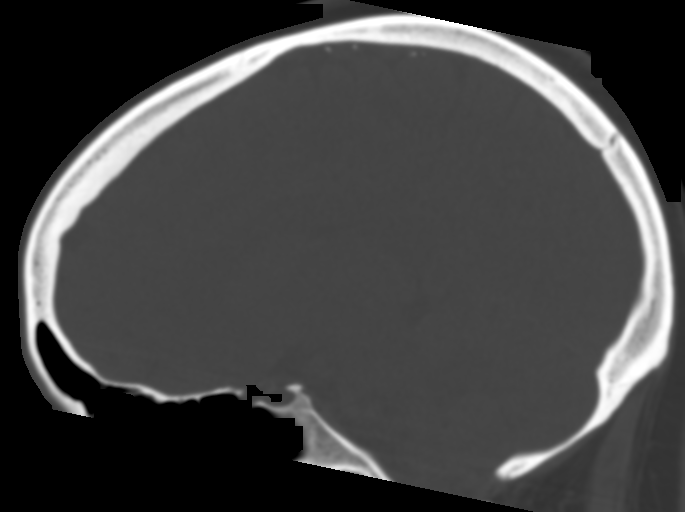
[im 38/57  bone]
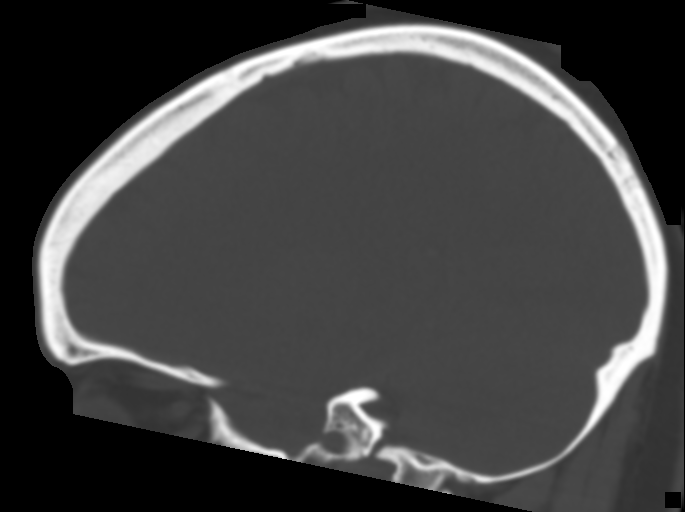

[14 of 33 positions shown; findings below may reference images not displayed]

FINDINGS: CT HEAD FINDINGS

Brain: No evidence of acute infarction, hemorrhage, hydrocephalus,
extra-axial collection or mass lesion/mass effect.

Vascular: No hyperdense vessel or unexpected calcification.

Skull: Normal. Negative for fracture or focal lesion.

Sinuses/Orbits: No acute finding.

Other: Borderline high-riding left jugular bulb.

CT CERVICAL SPINE FINDINGS

Alignment: Straightening of cervical lordosis.  No listhesis.

Skull base and vertebrae: No acute fracture. No primary bone lesion
or focal pathologic process.

Soft tissues and spinal canal: No prevertebral fluid or swelling. No
visible canal hematoma.

Disc levels:  No significant cervical spine degenerative changes.

Upper chest: Negative.

Other: Negative.
IMPRESSION: 1. No acute intracranial abnormality or displaced calvarial
fracture. Unremarkable CT of the head.
2. No acute fracture or dislocation of the cervical spine.
Unremarkable CT of the cervical spine.

By: Tameal Trevor M.D.

## 2017-12-25 DIAGNOSIS — Z8619 Personal history of other infectious and parasitic diseases: Secondary | ICD-10-CM | POA: Insufficient documentation

## 2018-02-09 ENCOUNTER — Other Ambulatory Visit: Payer: Self-pay

## 2018-02-09 ENCOUNTER — Ambulatory Visit
Admission: EM | Admit: 2018-02-09 | Discharge: 2018-02-09 | Disposition: A | Discharge status: Home / Self Care | Payer: Medicaid Other | Attending: Family Medicine | Admitting: Family Medicine

## 2018-02-09 ENCOUNTER — Encounter: Payer: Self-pay | Admitting: Emergency Medicine

## 2018-02-09 DIAGNOSIS — J209 Acute bronchitis, unspecified: Secondary | ICD-10-CM | POA: Diagnosis not present

## 2018-02-09 MED ORDER — HYDROCOD POLST-CPM POLST ER 10-8 MG/5ML PO SUER: 5.0000 mL | Freq: Two times a day (BID) | ORAL | 0 refills | Status: DC | PRN

## 2018-02-09 MED ORDER — PREDNISONE 50 MG PO TABS: ORAL_TABLET | ORAL | 0 refills | Status: DC

## 2018-02-09 MED ORDER — AZITHROMYCIN 250 MG PO TABS: ORAL_TABLET | ORAL | 0 refills | Status: DC

## 2018-02-09 MED ORDER — ALBUTEROL SULFATE HFA 108 (90 BASE) MCG/ACT IN AERS: 1.0000 | INHALATION_SPRAY | Freq: Four times a day (QID) | RESPIRATORY_TRACT | 0 refills | Status: DC | PRN

## 2018-02-09 NOTE — Discharge Instructions (Signed)
Meds as prescribed. ° °Take care ° °Dr. Liliauna Santoni  °

## 2018-02-09 NOTE — ED Provider Notes (Signed)
[facility]    CSN: [id number] Arrival date & time: 02/09/18  1143  History   Chief Complaint Chief Complaint  Patient presents with  . Cough   HPI  22 year old female presents with cough.  Patient reports a 5-day history of cough.  Productive.  Severe.  Worsening.  She reports associated posttussive emesis.  She feels very poorly.  Associated wheezing as well.  She is used albuterol without improvement.  No fever.  No known exacerbating factors.  No other associated symptoms.  No other complaints.  Past Medical History:  Diagnosis Date  . Anxiety   . Asthma   . Syncope    Past Surgical History:  Procedure Laterality Date  . CESAREAN SECTION     OB History   None    Home Medications    Prior to Admission medications   Medication Sig Start Date End Date Taking? Authorizing Provider  albuterol (PROVENTIL HFA;VENTOLIN HFA) 108 (90 Base) MCG/ACT inhaler Inhale 2 puffs into the lungs every 2 (two) hours as needed. Inhale 2 puffs into the lungs every 2-4 hours as needed. 06/18/16  Yes [provider]  medroxyPROGESTERone (DEPO-PROVERA) 150 MG/ML injection Inject 150 mg into the muscle every 3 (three) months.   Yes [provider]  albuterol (PROVENTIL HFA;VENTOLIN HFA) 108 (90 Base) MCG/ACT inhaler Inhale 1-2 puffs into the lungs every 6 (six) hours as needed for wheezing or shortness of breath. 02/09/18   Tommie Samsook, Ceairra Mccarver G, DO  azithromycin (ZITHROMAX) 250 MG tablet 2 tablets on day 1, then 1 tablet daily on days 2-5. 02/09/18   Tommie Samsook, Courtne Lighty G, DO  chlorpheniramine-HYDROcodone (TUSSIONEX PENNKINETIC ER) 10-8 MG/5ML SUER Take 5 mLs by mouth every 12 (twelve) hours as needed. 02/09/18   Tommie Samsook, Zohaib Heeney G, DO  predniSONE (DELTASONE) 50 MG tablet 1 tablet daily x 5 days. 02/09/18   Tommie Samsook, Emilyn Ruble G, DO   Family History Family History  Problem Relation Age of Onset  . Osteoarthritis Mother   . Depression Mother    Social History Social History   Tobacco Use  .  Smoking status: Current Every Day Smoker    Packs/day: 0.50    Types: Cigarettes  . Smokeless tobacco: Never Used  Substance Use Topics  . Alcohol use: Yes    Comment: occassionally  . Drug use: Yes    Comment: pt states she has a hx of drug addiciton; pt states she used any and everything.    Allergies   Patient has no known allergies.  Review of Systems Review of Systems  Constitutional: Negative for fever.  HENT: Positive for congestion.   Respiratory: Positive for cough and shortness of breath.   Gastrointestinal: Positive for vomiting.   Physical Exam Triage Vital Signs ED Triage Vitals  Enc Vitals Group     BP 02/09/18 1157 105/60     Pulse Rate 02/09/18 1157 97     Resp 02/09/18 1157 18     Temp 02/09/18 1157 97.6 F (36.4 C)     Temp src --      SpO2 02/09/18 1157 98 %     Weight 02/09/18 1154 270 lb (122.5 kg)     Height 02/09/18 1154 5\' 5"  (1.651 m)     Head Circumference --      Peak Flow --      Pain Score 02/09/18 1154 0     Pain Loc --      Pain Edu? --      Excl. in GC? --  Updated Vital Signs BP 105/60 (BP Location: Left Arm)   Pulse 97   Temp 97.6 F (36.4 C)   Resp 18   Ht 5\' 5"  (1.651 m)   Wt 270 lb (122.5 kg)   SpO2 98%   BMI 44.93 kg/m   Physical Exam  Constitutional: She is oriented to person, place, and time. She appears well-developed. No distress.  HENT:  Head: Normocephalic and atraumatic.  Mouth/Throat: Oropharynx is clear and moist.  Cardiovascular: Normal rate and regular rhythm.  Pulmonary/Chest: Effort normal.  Diffuse wheezing.  Neurological: She is alert and oriented to person, place, and time.  Psychiatric: She has a normal mood and affect. Her behavior is normal.  Vitals reviewed.  UC Treatments / Results  Labs (all labs ordered are listed, but only abnormal results are displayed) Labs Reviewed - No data to display  EKG None  Radiology No results found.  Procedures Procedures (including critical care  time)  Medications Ordered in UC Medications - No data to display  Initial Impression / Assessment and Plan / UC Course  I have reviewed the triage vital signs and the nursing notes.  Pertinent labs & imaging results that were available during my care of the patient were reviewed by me and considered in my medical decision making (see chart for details).    22 year old female presents with acute bronchitis.  Treating with prednisone, tussionex, azithromycin, albuterol.   Final Clinical Impressions(s) / UC Diagnoses   Final diagnoses:  Acute bronchitis, unspecified organism     Discharge Instructions     Meds as prescribed.  Take care  Dr. Adriana Simas    ED Prescriptions    Medication Sig Dispense Auth. Provider   albuterol (PROVENTIL HFA;VENTOLIN HFA) 108 (90 Base) MCG/ACT inhaler Inhale 1-2 puffs into the lungs every 6 (six) hours as needed for wheezing or shortness of breath. 1 Inhaler Jaaron Oleson G, DO   predniSONE (DELTASONE) 50 MG tablet 1 tablet daily x 5 days. 5 tablet Makailey Hodgkin G, DO   azithromycin (ZITHROMAX) 250 MG tablet 2 tablets on day 1, then 1 tablet daily on days 2-5. 6 tablet Lyrah Bradt G, DO   chlorpheniramine-HYDROcodone (TUSSIONEX PENNKINETIC ER) 10-8 MG/5ML SUER Take 5 mLs by mouth every 12 (twelve) hours as needed. 115 mL Tommie Sams, DO     Controlled Substance Prescriptions [place] Controlled Substance Registry consulted? Not Applicable   Tommie Sams, DO 02/09/18 1328

## 2018-02-09 NOTE — ED Triage Notes (Signed)
Patient states she developed a cough about 5 days ago.  Lots of congestion

## 2018-05-11 ENCOUNTER — Ambulatory Visit
Admission: EM | Admit: 2018-05-11 | Discharge: 2018-05-11 | Disposition: A | Discharge status: Home / Self Care | Payer: Medicaid Other | Attending: Family Medicine | Admitting: Family Medicine

## 2018-05-11 ENCOUNTER — Encounter: Payer: Self-pay | Admitting: Emergency Medicine

## 2018-05-11 ENCOUNTER — Other Ambulatory Visit: Payer: Self-pay

## 2018-05-11 DIAGNOSIS — J069 Acute upper respiratory infection, unspecified: Secondary | ICD-10-CM | POA: Diagnosis not present

## 2018-05-11 DIAGNOSIS — B308 Other viral conjunctivitis: Secondary | ICD-10-CM

## 2018-05-11 DIAGNOSIS — B309 Viral conjunctivitis, unspecified: Secondary | ICD-10-CM

## 2018-05-11 MED ORDER — FLUTICASONE PROPIONATE 50 MCG/ACT NA SUSP: 2.0000 | Freq: Every day | NASAL | 0 refills | Status: DC

## 2018-05-11 MED ORDER — BENZONATATE 200 MG PO CAPS: ORAL_CAPSULE | ORAL | 0 refills | Status: DC

## 2018-05-11 MED ORDER — POLYMYXIN B-TRIMETHOPRIM 10000-0.1 UNIT/ML-% OP SOLN: 1.0000 [drp] | OPHTHALMIC | 0 refills | Status: DC

## 2018-05-11 MED ORDER — GUAIFENESIN-CODEINE 100-10 MG/5ML PO SYRP: 5.0000 mL | ORAL_SOLUTION | Freq: Three times a day (TID) | ORAL | 0 refills | Status: DC | PRN

## 2018-05-11 NOTE — Discharge Instructions (Signed)
Apply cool compresses to your eye as necessary for comfort.  Plenty of fluids.. Rest as much as possible.  This is likely a virus.  If you run high fevers or not improving in a week or so return to our clinic or follow-up with your primary care physician.

## 2018-05-11 NOTE — ED Triage Notes (Signed)
Pt c/o cough, nasal congestion, sore throat, headache, right eye redness, itching. Her daughter has had pink eye. URI started 3 days ago. Pink eye started yesterday.

## 2018-05-11 NOTE — ED Provider Notes (Signed)
[facility]    CSN: [id number] Arrival date & time: 05/11/18  1237     History   Chief Complaint Chief Complaint  Patient presents with  . Cough    HPI Susan Swanson is a 22 y.o. female.   HPI  22 year old presents with a 2-day history cough, nasal congestion, sore throat, headache, right eye redness that has been itching.  States her daughter was seen here last week and diagnosed with conjunctivitis.  The pinkeye started bothering her yesterday.  Had no fever or chills.  She is a every day smoker.  Afebrile today.      Past Medical History:  Diagnosis Date  . Anxiety   . Asthma   . Syncope     There are no active problems to display for this patient.   Past Surgical History:  Procedure Laterality Date  . CESAREAN SECTION      OB History   None      Home Medications    Prior to Admission medications   Medication Sig Start Date End Date Taking? Authorizing Provider  medroxyPROGESTERone (DEPO-PROVERA) 150 MG/ML injection Inject 150 mg into the muscle every 3 (three) months.   Yes [provider]  benzonatate (TESSALON) 200 MG capsule Take one cap TID PRN cough 05/11/18   Lutricia Feil, PA-C  fluticasone (FLONASE) 50 MCG/ACT nasal spray Place 2 sprays into both nostrils daily. 05/11/18   Lutricia Feil, PA-C  guaiFENesin-codeine (CHERATUSSIN AC) 100-10 MG/5ML syrup Take 5 mLs by mouth 3 (three) times daily as needed for cough. 05/11/18   Lutricia Feil, PA-C  trimethoprim-polymyxin b (POLYTRIM) ophthalmic solution Place 1 drop into the right eye every 4 (four) hours. 05/11/18   Lutricia Feil, PA-C    Family History Family History  Problem Relation Age of Onset  . Osteoarthritis Mother   . Depression Mother     Social History Social History   Tobacco Use  . Smoking status: Current Every Day Smoker    Packs/day: 0.50    Types: Cigarettes  . Smokeless tobacco: Never Used  Substance Use Topics  . Alcohol use: Yes    Comment: occassionally  . Drug use: Not Currently    Comment: pt states she has a hx of drug addiciton; pt states she used any and everything.      Allergies   Patient has no known allergies.   Review of Systems Review of Systems  Constitutional: Positive for activity change. Negative for appetite change, chills, fatigue and fever.  HENT: Positive for congestion, postnasal drip, rhinorrhea and sore throat.   Eyes: Positive for discharge, redness and itching. Negative for photophobia, pain and visual disturbance.  Respiratory: Positive for cough. Negative for shortness of breath, wheezing and stridor.   All other systems reviewed and are negative.    Physical Exam Triage Vital Signs ED Triage Vitals  Enc Vitals Group     BP 05/11/18 1255 112/79     Pulse Rate 05/11/18 1255 (!) 108     Resp 05/11/18 1255 18     Temp 05/11/18 1255 97.7 F (36.5 C)     Temp Source 05/11/18 1255 Oral     SpO2 05/11/18 1255 99 %     Weight 05/11/18 1254 290 lb (131.5 kg)     Height 05/11/18 1254 5\' 5"  (1.651 m)     Head Circumference --      Peak Flow --      Pain Score 05/11/18 1253 8  Pain Loc --      Pain Edu? --      Excl. in GC? --    No data found.  Updated Vital Signs BP 112/79 (BP Location: Left Arm)   Pulse (!) 108   Temp 97.7 F (36.5 C) (Oral)   Resp 18   Ht 5\' 5"  (1.651 m)   Wt 290 lb (131.5 kg)   SpO2 99%   BMI 48.26 kg/m   Visual Acuity Right Eye Distance:   Left Eye Distance:   Bilateral Distance:    Right Eye Near:   Left Eye Near:    Bilateral Near:     Physical Exam  Constitutional: She is oriented to person, place, and time. She appears well-developed and well-nourished. No distress.  HENT:  Head: Normocephalic.  Right Ear: External ear normal.  Left Ear: External ear normal.  Nose: Nose normal.  Mouth/Throat: Oropharynx is clear and moist.  Eyes: Pupils are equal, round, and reactive to light. Right eye exhibits discharge.  Neck: Normal range  of motion.  Pulmonary/Chest: Effort normal and breath sounds normal.  Musculoskeletal: Normal range of motion.  Lymphadenopathy:    She has no cervical adenopathy.  Neurological: She is alert and oriented to person, place, and time.  Skin: Skin is warm and dry. She is not diaphoretic.  Psychiatric: She has a normal mood and affect. Her behavior is normal. Judgment and thought content normal.  Nursing note and vitals reviewed.    UC Treatments / Results  Labs (all labs ordered are listed, but only abnormal results are displayed) Labs Reviewed - No data to display  EKG None  Radiology No results found.  Procedures Procedures (including critical care time)  Medications Ordered in UC Medications - No data to display  Initial Impression / Assessment and Plan / UC Course  I have reviewed the triage vital signs and the nursing notes.  Pertinent labs & imaging results that were available during my care of the patient were reviewed by me and considered in my medical decision making (see chart for details).   Patient is likely a viral illness does not require antibiotics at this time.  Due to her conjunctivitis the same as was treated here for her child I will prescribe her with the same medication.  Also prescribed cough suppressants.  She will use Flonase on a daily basis.  Not improving in a week she may return to our clinic or follow-up with her primary care physician   Final Clinical Impressions(s) / UC Diagnoses   Final diagnoses:  Upper respiratory tract infection, unspecified type  Acute viral conjunctivitis of right eye     Discharge Instructions     Apply cool compresses to your eye as necessary for comfort.  Plenty of fluids.. Rest as much as possible.  This is likely a virus.  If you run high fevers or not improving in a week or so return to our clinic or follow-up with your primary care physician.   ED Prescriptions    Medication Sig Dispense Auth. Provider    fluticasone (FLONASE) 50 MCG/ACT nasal spray Place 2 sprays into both nostrils daily. 16 g Ovid Curd P, PA-C   benzonatate (TESSALON) 200 MG capsule Take one cap TID PRN cough 21 capsule Ovid Curd P, PA-C   trimethoprim-polymyxin b (POLYTRIM) ophthalmic solution Place 1 drop into the right eye every 4 (four) hours. 10 mL Ovid Curd P, PA-C   guaiFENesin-codeine (CHERATUSSIN AC) 100-10 MG/5ML syrup Take 5 mLs  by mouth 3 (three) times daily as needed for cough. 60 mL Lutricia Feil, PA-C     Controlled Substance Prescriptions [place] Controlled Substance Registry consulted? Not Applicable   Lutricia Feil, PA-C 05/11/18 1347

## 2018-05-15 ENCOUNTER — Ambulatory Visit
Admission: EM | Admit: 2018-05-15 | Discharge: 2018-05-15 | Disposition: A | Discharge status: Home / Self Care | Payer: Medicaid Other | Attending: Family Medicine | Admitting: Family Medicine

## 2018-05-15 DIAGNOSIS — J988 Other specified respiratory disorders: Secondary | ICD-10-CM

## 2018-05-15 DIAGNOSIS — H1033 Unspecified acute conjunctivitis, bilateral: Secondary | ICD-10-CM

## 2018-05-15 DIAGNOSIS — J45901 Unspecified asthma with (acute) exacerbation: Secondary | ICD-10-CM

## 2018-05-15 MED ORDER — PREDNISONE 50 MG PO TABS: ORAL_TABLET | ORAL | 0 refills | Status: DC

## 2018-05-15 MED ORDER — DOXYCYCLINE HYCLATE 100 MG PO CAPS: 100.0000 mg | ORAL_CAPSULE | Freq: Two times a day (BID) | ORAL | 0 refills | Status: DC

## 2018-05-15 MED ORDER — CIPROFLOXACIN HCL 0.3 % OP SOLN: OPHTHALMIC | 0 refills | Status: DC

## 2018-05-15 MED ORDER — HYDROCOD POLST-CPM POLST ER 10-8 MG/5ML PO SUER: 5.0000 mL | Freq: Two times a day (BID) | ORAL | 0 refills | Status: DC | PRN

## 2018-05-15 NOTE — Discharge Instructions (Signed)
Medication as prescribed.  Take care  Dr. Verlean Allport  

## 2018-05-15 NOTE — ED Provider Notes (Signed)
[facility]    CSN: [id number] Arrival date & time: 05/15/18  0844   History   Chief Complaint Chief Complaint  Patient presents with  . Cough   HPI  22 year old female presents with persistent respiratory symptoms.  Patient was seen on 9/10.  She was treated for an upper respiratory tract infection and conjunctivitis.  Patient states that she has failed to improve.  In fact, she is worsening.  Symptoms are severe.  She reports cough, chest tightness, wheezing, shortness of breath.  Has not improved despite use of albuterol.  She reports continued eye redness and discharge.  She has active discharge currently.  Severe cough.  Interfering with sleep.  She has had no improvement with cough medication that was given.  No reports of fever.  Patient looks and feels poorly.  No other associated symptoms.  No other complaints  PMH, Surgical Hx, Family Hx, Social History reviewed and updated as below.  Past Medical History:  Diagnosis Date  . Anxiety   . Asthma   . Syncope    Past Surgical History:  Procedure Laterality Date  . CESAREAN SECTION      OB History   None      Home Medications    Prior to Admission medications   Medication Sig Start Date End Date Taking? Authorizing Provider  fluticasone (FLONASE) 50 MCG/ACT nasal spray Place 2 sprays into both nostrils daily. 05/11/18  Yes Lutricia Feiloemer, William P, PA-C  medroxyPROGESTERone (DEPO-PROVERA) 150 MG/ML injection Inject 150 mg into the muscle every 3 (three) months.   Yes [provider]  chlorpheniramine-HYDROcodone (TUSSIONEX PENNKINETIC ER) 10-8 MG/5ML SUER Take 5 mLs by mouth every 12 (twelve) hours as needed. 05/15/18   Tommie Samsook, Ellie Spickler G, DO  ciprofloxacin (CILOXAN) 0.3 % ophthalmic solution Administer 1 drop, every 2 hours, while awake, for 2 days. Then 1 drop, every 4 hours, while awake, for the next 5 days. 05/15/18   Tommie Samsook, Joram Venson G, DO  doxycycline (VIBRAMYCIN) 100 MG capsule Take 1 capsule (100 mg total)  by mouth 2 (two) times daily. 05/15/18   Tommie Samsook, Delena Casebeer G, DO  predniSONE (DELTASONE) 50 MG tablet 1 tablet daily x 5 days 05/15/18   Tommie Samsook, Marios Gaiser G, DO    Family History Family History  Problem Relation Age of Onset  . Osteoarthritis Mother   . Depression Mother     Social History Social History   Tobacco Use  . Smoking status: Current Every Day Smoker    Packs/day: 0.50    Types: Cigarettes  . Smokeless tobacco: Never Used  Substance Use Topics  . Alcohol use: Yes    Comment: occassionally  . Drug use: Not Currently    Comment: pt states she has a hx of drug addiciton; pt states she used any and everything.      Allergies   Patient has no known allergies.   Review of Systems Review of Systems  Constitutional: Negative for fever.  Eyes: Positive for discharge and redness.  Respiratory: Positive for cough, chest tightness and shortness of breath.   Psychiatric/Behavioral: Positive for sleep disturbance.   Physical Exam Triage Vital Signs ED Triage Vitals  Enc Vitals Group     BP 05/15/18 0853 128/75     Pulse Rate 05/15/18 0853 84     Resp 05/15/18 0853 18     Temp 05/15/18 0853 98 F (36.7 C)     Temp Source 05/15/18 0853 Oral     SpO2 05/15/18 0853 98 %  Weight 05/15/18 0855 289 lb 14.5 oz (131.5 kg)     Height --      Head Circumference --      Peak Flow --      Pain Score 05/15/18 0855 8     Pain Loc --      Pain Edu? --      Excl. in GC? --    Updated Vital Signs BP 128/75 (BP Location: Right Arm)   Pulse 84   Temp 98 F (36.7 C) (Oral)   Resp 18   Wt 131.5 kg   SpO2 98%   BMI 48.24 kg/m   Visual Acuity Right Eye Distance:   Left Eye Distance:   Bilateral Distance:    Right Eye Near:   Left Eye Near:    Bilateral Near:     Physical Exam  Constitutional: She is oriented to person, place, and time. She appears well-developed.  No apparent distress but looks ill.  HENT:  Head: Normocephalic and atraumatic.  Mouth/Throat: Oropharynx is  clear and moist.  Eyes:  Lateral conjunctival injection with green discharge noted out of the right eye.  Cardiovascular: Normal rate and regular rhythm.  Pulmonary/Chest: Effort normal.  Diffuse expiratory wheezing.  Neurological: She is alert and oriented to person, place, and time.  Psychiatric: She has a normal mood and affect. Her behavior is normal.  Nursing note and vitals reviewed.  UC Treatments / Results  Labs (all labs ordered are listed, but only abnormal results are displayed) Labs Reviewed - No data to display  EKG None  Radiology No results found.  Procedures Procedures (including critical care time)  Medications Ordered in UC Medications - No data to display  Initial Impression / Assessment and Plan / UC Course  I have reviewed the triage vital signs and the nursing notes.  Pertinent labs & imaging results that were available during my care of the patient were reviewed by me and considered in my medical decision making (see chart for details).    22 year old female presents with persistent conjunctivitis, signs of ongoing respiratory infection which has led to an asthma exacerbation.  Given lack of improvement and persistent severe symptoms I am treating her aggressively with doxycycline and prednisone.  I have stopped her Polytrim and prescribed Cipro eyedrops.  Lastly, I am giving her Tussionex for her cough per her request.  Final Clinical Impressions(s) / UC Diagnoses   Final diagnoses:  Exacerbation of asthma, unspecified asthma severity, unspecified whether persistent  Acute bacterial conjunctivitis of both eyes  Respiratory infection     Discharge Instructions     Medication as prescribed.  Take care  Dr. Adriana Simas    ED Prescriptions    Medication Sig Dispense Auth. Provider   doxycycline (VIBRAMYCIN) 100 MG capsule Take 1 capsule (100 mg total) by mouth 2 (two) times daily. 14 capsule Garold Sheeler G, DO   predniSONE (DELTASONE) 50 MG tablet 1  tablet daily x 5 days 5 tablet Damen Windsor G, DO   chlorpheniramine-HYDROcodone (TUSSIONEX PENNKINETIC ER) 10-8 MG/5ML SUER Take 5 mLs by mouth every 12 (twelve) hours as needed. 115 mL [place] Fant G, DO   ciprofloxacin (CILOXAN) 0.3 % ophthalmic solution Administer 1 drop, every 2 hours, while awake, for 2 days. Then 1 drop, every 4 hours, while awake, for the next 5 days. 5 mL Tommie Sams, DO     Controlled Substance Prescriptions [place] Controlled Substance Registry consulted? Not Applicable   Tommie Sams, DO 05/15/18 1008

## 2018-05-15 NOTE — ED Triage Notes (Signed)
Pt here for productive cough, SOB, wheezing and eyes aren't getting better states she was here on the 10th. Unable to get any sleep and did do a breathing treatment without relief before she came over.

## 2018-06-14 ENCOUNTER — Other Ambulatory Visit: Payer: Self-pay

## 2018-06-14 ENCOUNTER — Ambulatory Visit
Admission: EM | Admit: 2018-06-14 | Discharge: 2018-06-14 | Disposition: A | Discharge status: Home / Self Care | Payer: Medicaid Other | Attending: Family Medicine | Admitting: Family Medicine

## 2018-06-14 ENCOUNTER — Emergency Department
Admission: EM | Admit: 2018-06-14 | Discharge: 2018-06-14 | Disposition: A | Discharge status: Home / Self Care | Payer: Medicaid Other | Attending: Student in an Organized Health Care Education/Training Program | Admitting: Student in an Organized Health Care Education/Training Program

## 2018-06-14 ENCOUNTER — Emergency Department: Payer: Medicaid Other

## 2018-06-14 ENCOUNTER — Encounter: Payer: Self-pay | Admitting: Emergency Medicine

## 2018-06-14 ENCOUNTER — Encounter: Payer: Self-pay | Admitting: *Deleted

## 2018-06-14 DIAGNOSIS — R1033 Periumbilical pain: Secondary | ICD-10-CM | POA: Diagnosis not present

## 2018-06-14 DIAGNOSIS — R1031 Right lower quadrant pain: Secondary | ICD-10-CM

## 2018-06-14 DIAGNOSIS — F17210 Nicotine dependence, cigarettes, uncomplicated: Secondary | ICD-10-CM | POA: Insufficient documentation

## 2018-06-14 DIAGNOSIS — J45909 Unspecified asthma, uncomplicated: Secondary | ICD-10-CM | POA: Insufficient documentation

## 2018-06-14 DIAGNOSIS — F419 Anxiety disorder, unspecified: Secondary | ICD-10-CM | POA: Diagnosis not present

## 2018-06-14 DIAGNOSIS — R112 Nausea with vomiting, unspecified: Secondary | ICD-10-CM | POA: Diagnosis not present

## 2018-06-14 DIAGNOSIS — Z79899 Other long term (current) drug therapy: Secondary | ICD-10-CM | POA: Insufficient documentation

## 2018-06-14 LAB — CBC
HCT: 42.7 % (ref 36.0–46.0)
Hemoglobin: 14.1 g/dL (ref 12.0–15.0)
MCH: 27.6 pg (ref 26.0–34.0)
MCHC: 33 g/dL (ref 30.0–36.0)
MCV: 83.7 fL (ref 80.0–100.0)
PLATELETS: 285 10*3/uL (ref 150–400)
RBC: 5.1 MIL/uL (ref 3.87–5.11)
RDW: 12.1 % (ref 11.5–15.5)
WBC: 14 10*3/uL — AB (ref 4.0–10.5)
nRBC: 0 % (ref 0.0–0.2)

## 2018-06-14 LAB — URINALYSIS, COMPLETE (UACMP) WITH MICROSCOPIC
BILIRUBIN URINE: NEGATIVE
Bacteria, UA: NONE SEEN
Bilirubin Urine: NEGATIVE
GLUCOSE, UA: NEGATIVE mg/dL
Glucose, UA: NEGATIVE mg/dL
HGB URINE DIPSTICK: NEGATIVE
HGB URINE DIPSTICK: NEGATIVE
Ketones, ur: NEGATIVE mg/dL
Ketones, ur: NEGATIVE mg/dL
LEUKOCYTES UA: NEGATIVE
Leukocytes, UA: NEGATIVE
NITRITE: NEGATIVE
Nitrite: NEGATIVE
PH: 6.5 (ref 5.0–8.0)
PH: 6 (ref 5.0–8.0)
Protein, ur: NEGATIVE mg/dL
Protein, ur: NEGATIVE mg/dL
SPECIFIC GRAVITY, URINE: 1.006 (ref 1.005–1.030)
Specific Gravity, Urine: 1.015 (ref 1.005–1.030)
WBC, UA: NONE SEEN WBC/hpf (ref 0–5)

## 2018-06-14 LAB — COMPREHENSIVE METABOLIC PANEL
ALT: 17 U/L (ref 0–44)
AST: 19 U/L (ref 15–41)
Albumin: 4.1 g/dL (ref 3.5–5.0)
Alkaline Phosphatase: 85 U/L (ref 38–126)
Anion gap: 9 (ref 5–15)
BILIRUBIN TOTAL: 0.6 mg/dL (ref 0.3–1.2)
BUN: 8 mg/dL (ref 6–20)
CALCIUM: 9.1 mg/dL (ref 8.9–10.3)
CO2: 24 mmol/L (ref 22–32)
CREATININE: 0.73 mg/dL (ref 0.44–1.00)
Chloride: 105 mmol/L (ref 98–111)
GFR calc Af Amer: >60 mL/min (ref >60)
Glucose, Bld: 85 mg/dL (ref 70–99)
POTASSIUM: 3.6 mmol/L (ref 3.5–5.1)
Sodium: 138 mmol/L (ref 135–145)
TOTAL PROTEIN: 7.6 g/dL (ref 6.5–8.1)

## 2018-06-14 LAB — LIPASE, BLOOD: Lipase: 23 U/L (ref 11–51)

## 2018-06-14 LAB — PREGNANCY, URINE: Preg Test, Ur: NEGATIVE

## 2018-06-14 MED ORDER — IOPAMIDOL (ISOVUE-300) INJECTION 61%
125.0000 mL | Freq: Once | INTRAVENOUS | Status: AC | PRN
  Administered 2018-06-14: 125 mL via INTRAVENOUS
  Filled 2018-06-14: qty 150

## 2018-06-14 MED ORDER — PROCHLORPERAZINE MALEATE 10 MG PO TABS: 10.0000 mg | ORAL_TABLET | Freq: Four times a day (QID) | ORAL | 0 refills | Status: DC | PRN

## 2018-06-14 MED ORDER — SODIUM CHLORIDE 0.9 % IV BOLUS
1000.0000 mL | Freq: Once | INTRAVENOUS | Status: AC
  Administered 2018-06-14: 1000 mL via INTRAVENOUS

## 2018-06-14 MED ORDER — MORPHINE SULFATE (PF) 4 MG/ML IV SOLN
4.0000 mg | INTRAVENOUS | Status: DC | PRN
  Administered 2018-06-14: 4 mg via INTRAVENOUS
  Filled 2018-06-14: qty 1

## 2018-06-14 MED ORDER — ONDANSETRON HCL 4 MG/2ML IJ SOLN
4.0000 mg | Freq: Once | INTRAMUSCULAR | Status: AC | PRN
  Administered 2018-06-14: 4 mg via INTRAVENOUS
  Filled 2018-06-14: qty 2

## 2018-06-14 NOTE — Discharge Instructions (Signed)

## 2018-06-14 NOTE — ED Provider Notes (Signed)
[facility] Emergency Department Provider Note    First MD Initiated Contact with Patient 06/14/18 1908     (approximate)  I have reviewed the triage vital signs and the nursing notes.   HISTORY  Chief Complaint Abdominal Pain    HPI Susan Swanson is a 22 y.o. female no significant past medical history presents to the ER for abdominal pain discomfort associated nausea vomiting x4 today.  States that she was having periumbilical pain roughly 2 days ago progressing now migrating the right lower quadrant.  Went to urgent care today for evaluation due to the right lower quadrant pain and history she was sent to the ER for further evaluation.  States the pain is mild to moderate.  Denies any diarrhea.  No dysuria or vaginal discharge.  No chance of being pregnant.    Past Medical History:  Diagnosis Date  . Anxiety   . Asthma   . Syncope    Family History  Problem Relation Age of Onset  . Osteoarthritis Mother   . Depression Mother   . Healthy Father    Past Surgical History:  Procedure Laterality Date  . CESAREAN SECTION     There are no active problems to display for this patient.     Prior to Admission medications   Medication Sig Start Date End Date Taking? Authorizing Provider  albuterol (PROVENTIL HFA;VENTOLIN HFA) 108 (90 Base) MCG/ACT inhaler 2 puff every 2-4 hours as needed 06/18/16   [provider]  medroxyPROGESTERone (DEPO-PROVERA) 150 MG/ML injection Inject 150 mg into the muscle every 3 (three) months.    [provider]  prochlorperazine (COMPAZINE) 10 MG tablet Take 1 tablet (10 mg total) by mouth every 6 (six) hours as needed for nausea or vomiting. 06/14/18   Willy Eddy, MD    Allergies Patient has no known allergies.    Social History Social History   Tobacco Use  . Smoking status: Current Every Day Smoker    Packs/day: 0.50    Types: Cigarettes  . Smokeless tobacco: Never Used    Substance Use Topics  . Alcohol use: Yes    Comment: occassionally  . Drug use: Not Currently    Comment: pt states she has a hx of drug addiciton; pt states she used any and everything.     Review of Systems Patient denies headaches, rhinorrhea, blurry vision, numbness, shortness of breath, chest pain, edema, cough, abdominal pain, nausea, vomiting, diarrhea, dysuria, fevers, rashes or hallucinations unless otherwise stated above in HPI. ____________________________________________   PHYSICAL EXAM:  VITAL SIGNS: Vitals:   06/14/18 1833  BP: 105/71  Pulse: 87  Resp: 20  Temp: 98.1 F (36.7 C)  SpO2: 98%    Constitutional: Alert and oriented.  Eyes: Conjunctivae are normal.  Head: Atraumatic. Nose: No congestion/rhinnorhea. Mouth/Throat: Mucous membranes are moist.   Neck: No stridor. Painless ROM.  Cardiovascular: Normal rate, regular rhythm. Grossly normal heart sounds.  Good peripheral circulation. Respiratory: Normal respiratory effort.  No retractions. Lungs CTAB. Gastrointestinal: obese but Soft, mild nontender. No distention. No abdominal bruits. No CVA tenderness. Genitourinary:  Musculoskeletal: No lower extremity tenderness nor edema.  No joint effusions. Neurologic:  Normal speech and language. No gross focal neurologic deficits are appreciated. No facial droop Skin:  Skin is warm, dry and intact. No rash noted. Psychiatric: Mood and affect are normal. Speech and behavior are normal.  ____________________________________________   LABS (all labs ordered are listed, but only abnormal results are displayed)  Results for orders placed or performed during the hospital encounter of 06/14/18 (from the past 24 hour(s))  Urinalysis, Complete w Microscopic     Status: Abnormal   Collection Time: 06/14/18  7:01 PM  Result Value Ref Range   Color, Urine STRAW (A) YELLOW   APPearance CLEAR (A) CLEAR   Specific Gravity, Urine 1.006 1.005 - 1.030   pH 6.0 5.0 - 8.0    Glucose, UA NEGATIVE NEGATIVE mg/dL   Hgb urine dipstick NEGATIVE NEGATIVE   Bilirubin Urine NEGATIVE NEGATIVE   Ketones, ur NEGATIVE NEGATIVE mg/dL   Protein, ur NEGATIVE NEGATIVE mg/dL   Nitrite NEGATIVE NEGATIVE   Leukocytes, UA NEGATIVE NEGATIVE   WBC, UA 0-5 0 - 5 WBC/hpf   Bacteria, UA RARE (A) NONE SEEN   Squamous Epithelial / LPF 0-5 0 - 5   Mucus PRESENT   Pregnancy, urine     Status: None   Collection Time: 06/14/18  7:01 PM  Result Value Ref Range   Preg Test, Ur NEGATIVE NEGATIVE  Lipase, blood     Status: None   Collection Time: 06/14/18  7:49 PM  Result Value Ref Range   Lipase 23 11 - 51 U/L  Comprehensive metabolic panel     Status: None   Collection Time: 06/14/18  7:49 PM  Result Value Ref Range   Sodium 138 135 - 145 mmol/L   Potassium 3.6 3.5 - 5.1 mmol/L   Chloride 105 98 - 111 mmol/L   CO2 24 22 - 32 mmol/L   Glucose, Bld 85 70 - 99 mg/dL   BUN 8 6 - 20 mg/dL   Creatinine, Ser 4.09 0.44 - 1.00 mg/dL   Calcium 9.1 8.9 - 81.1 mg/dL   Total Protein 7.6 6.5 - 8.1 g/dL   Albumin 4.1 3.5 - 5.0 g/dL   AST 19 15 - 41 U/L   ALT 17 0 - 44 U/L   Alkaline Phosphatase 85 38 - 126 U/L   Total Bilirubin 0.6 0.3 - 1.2 mg/dL   GFR calc non Af Amer >60 >60 mL/min   GFR calc Af Amer >60 >60 mL/min   Anion gap 9 5 - 15  CBC     Status: Abnormal   Collection Time: 06/14/18  7:49 PM  Result Value Ref Range   WBC 14.0 (H) 4.0 - 10.5 K/uL   RBC 5.10 3.87 - 5.11 MIL/uL   Hemoglobin 14.1 12.0 - 15.0 g/dL   HCT 91.4 78.2 - 95.6 %   MCV 83.7 80.0 - 100.0 fL   MCH 27.6 26.0 - 34.0 pg   MCHC 33.0 30.0 - 36.0 g/dL   RDW 21.3 08.6 - 57.8 %   Platelets 285 150 - 400 K/uL   nRBC 0.0 0.0 - 0.2 %   ________________________________________________________________________  RADIOLOGY  I personally reviewed all radiographic images ordered to evaluate for the above acute complaints and reviewed radiology reports and findings.  These findings were personally discussed with the  patient.  Please see medical record for radiology report.  ____________________________________________   PROCEDURES  Procedure(s) performed:  Procedures    Critical Care performed: no ____________________________________________   INITIAL IMPRESSION / ASSESSMENT AND PLAN / ED COURSE  Pertinent labs & imaging results that were available during my care of the patient were reviewed by me and considered in my medical decision making (see chart for details).   DDX: Appendicitis, enteritis, adenitis, UTI, ectopic, ovarian cyst, stone, musculoskeletal strain.  Susan Swanson is a 22 y.o.  who presents to the ED with symptoms as described above.  Patient is AFVSS in ED. Exam as above. Given current presentation have considered the above differential.  Given mild leukocytosis and abdominal pain CT imaging was ordered for the above differential.  Patient was given IV fluids as well as IV pain and medication and IV nausea medication.  CT imaging fortunately does not show any evidence of acute appendicitis or other explanation of her pain.  Possibly nonspecific viral illness.  No evidence of UTI.  At this point I do believe patient stable and appropriate for trial of outpatient management.      As part of my medical decision making, I reviewed the following data within the electronic MEDICAL RECORD NUMBER Nursing notes reviewed and incorporated, Labs reviewed, notes from prior ED visits and [place] Controlled Substance Database   ____________________________________________   FINAL CLINICAL IMPRESSION(S) / ED DIAGNOSES  Final diagnoses:  Right lower quadrant abdominal pain      NEW MEDICATIONS STARTED DURING THIS VISIT:  New Prescriptions   PROCHLORPERAZINE (COMPAZINE) 10 MG TABLET    Take 1 tablet (10 mg total) by mouth every 6 (six) hours as needed for nausea or vomiting.     Note:  This document was prepared using Dragon voice recognition software and may include unintentional  dictation errors.    Willy Eddy, MD 06/14/18 [phone]

## 2018-06-14 NOTE — ED Notes (Signed)
Patient transported to CT 

## 2018-06-14 NOTE — ED Triage Notes (Signed)
Patient in today c/o umbilical pain that started on Saturday (06/12/18) and now sharp pains in RLQ that started late last night (06/13/18). Patient also states she has had nausea and vomiting and can't keep anything down.

## 2018-06-14 NOTE — ED Triage Notes (Signed)
Pt presents w/ abdominal pain starting today, n/v x 4 today. Pt states umbilical pain starting x 2 days ago.

## 2018-06-14 NOTE — ED Provider Notes (Addendum)
[facility]    CSN: [id number] Arrival date & time: 06/14/18  1651     History   Chief Complaint Chief Complaint  Patient presents with  . Abdominal Pain    APPT    HPI Susan Swanson is a 21 y.o. female.   HPI  22 year old female presents today C/O periumbilical pain that started on Saturday, 06/12/2018.  She states that over that period  Of time until today the pain is now very sharp in her right lower quadrant that started last night.  Had nausea and vomiting all day today.  She had no diarrhea and her last bowel movement was 2 days ago which is normal for her.  He is not hungry.  Afebrile today.  Pulse rate of 96 blood pressure 117/70 respiration 16 O2 sats 100% on room air.  Denies any vaginal discharge.  She is on Depo-Provera and does not have periods.        Past Medical History:  Diagnosis Date  . Anxiety   . Asthma   . Syncope     There are no active problems to display for this patient.   Past Surgical History:  Procedure Laterality Date  . CESAREAN SECTION      OB History   None      Home Medications    Prior to Admission medications   Medication Sig Start Date End Date Taking? Authorizing Provider  albuterol (PROVENTIL HFA;VENTOLIN HFA) 108 (90 Base) MCG/ACT inhaler 2 puff every 2-4 hours as needed 06/18/16  Yes [provider]  medroxyPROGESTERone (DEPO-PROVERA) 150 MG/ML injection Inject 150 mg into the muscle every 3 (three) months.   Yes [provider]    Family History Family History  Problem Relation Age of Onset  . Osteoarthritis Mother   . Depression Mother   . Healthy Father     Social History Social History   Tobacco Use  . Smoking status: Current Every Day Smoker    Packs/day: 0.50    Types: Cigarettes  . Smokeless tobacco: Never Used  Substance Use Topics  . Alcohol use: Yes    Comment: occassionally  . Drug use: Not Currently    Comment: pt states she has a hx of drug addiciton;  pt states she used any and everything.      Allergies   Patient has no known allergies.   Review of Systems Review of Systems  Constitutional: Positive for activity change and appetite change. Negative for chills, fatigue and fever.  Gastrointestinal: Positive for abdominal pain, nausea and vomiting.  All other systems reviewed and are negative.    Physical Exam Triage Vital Signs ED Triage Vitals  Enc Vitals Group     BP 06/14/18 1708 117/70     Pulse Rate 06/14/18 1708 96     Resp 06/14/18 1708 16     Temp 06/14/18 1708 98.5 F (36.9 C)     Temp Source 06/14/18 1708 Oral     SpO2 06/14/18 1708 100 %     Weight 06/14/18 1706 300 lb (136.1 kg)     Height 06/14/18 1706 5\' 5"  (1.651 m)     Head Circumference --      Peak Flow --      Pain Score 06/14/18 1706 8     Pain Loc --      Pain Edu? --      Excl. in GC? --    No data found.  Updated Vital Signs BP 117/70 (BP  Location: Left Arm)   Pulse 96   Temp 98.5 F (36.9 C) (Oral)   Resp 16   Ht 5\' 5"  (1.651 m)   Wt 300 lb (136.1 kg)   SpO2 100%   BMI 49.92 kg/m   Visual Acuity Right Eye Distance:   Left Eye Distance:   Bilateral Distance:    Right Eye Near:   Left Eye Near:    Bilateral Near:     Physical Exam  Constitutional: She is oriented to person, place, and time. She appears well-developed and well-nourished.  Non-toxic appearance. She does not appear ill. No distress.  HENT:  Head: Normocephalic.  Eyes: Pupils are equal, round, and reactive to light.  Pulmonary/Chest: Effort normal and breath sounds normal.  Abdominal: Soft. Normal appearance and bowel sounds are normal. She exhibits no distension, no abdominal bruit, no ascites and no mass. There is tenderness in the right upper quadrant. There is rebound. There is no rigidity, no guarding and no CVA tenderness.  Neurological: She is alert and oriented to person, place, and time.  Skin: Skin is warm and dry.  Psychiatric: She has a normal mood  and affect. Her behavior is normal.  Nursing note and vitals reviewed.    UC Treatments / Results  Labs (all labs ordered are listed, but only abnormal results are displayed) Labs Reviewed  URINALYSIS, COMPLETE (UACMP) WITH MICROSCOPIC    EKG None  Radiology No results found.  Procedures Procedures (including critical care time)  Medications Ordered in UC Medications - No data to display  Initial Impression / Assessment and Plan / UC Course  I have reviewed the triage vital signs and the nursing notes.  Pertinent labs & imaging results that were available during my care of the patient were reviewed by me and considered in my medical decision making (see chart for details).   I spoke with the patient after examination.  Because of her right lower quadrant pain and the nausea and vomiting that she has had today starting with a periumbilical pain 2 days ago suspicious that she may have an impending appendicitis.  Emanation today does not show her to have a acute abdomen at this time.  Does have rebound, no Rosvig sign. We do Not have  CAT scan availability today at our facility.  I have recommended the patient that she go to the emergency room for further evaluation and imaging studies as indicated.  She left our facility in stable condition transported in private vehicle to [facility] emergency department.  Spoke with [facility], triage nurse, to tell her of the patient's condition and her leaving our facility     . Final Clinical Impressions(s) / UC Diagnoses   Final diagnoses:  Abdominal pain, RLQ (right lower quadrant)   Discharge Instructions   None    ED Prescriptions    None     Controlled Substance Prescriptions [place] Controlled Substance Registry consulted? Not Applicable   Lutricia Feil, PA-C 06/14/18 1749    Lutricia Feil, PA-C 06/14/18 2052

## 2018-06-28 ENCOUNTER — Other Ambulatory Visit: Payer: Self-pay

## 2018-06-28 ENCOUNTER — Ambulatory Visit
Admission: EM | Admit: 2018-06-28 | Discharge: 2018-06-28 | Disposition: A | Discharge status: Home / Self Care | Payer: Medicaid Other | Attending: Family Medicine | Admitting: Family Medicine

## 2018-06-28 DIAGNOSIS — B9789 Other viral agents as the cause of diseases classified elsewhere: Secondary | ICD-10-CM

## 2018-06-28 DIAGNOSIS — B372 Candidiasis of skin and nail: Secondary | ICD-10-CM | POA: Diagnosis not present

## 2018-06-28 DIAGNOSIS — J45901 Unspecified asthma with (acute) exacerbation: Secondary | ICD-10-CM | POA: Diagnosis not present

## 2018-06-28 DIAGNOSIS — J069 Acute upper respiratory infection, unspecified: Secondary | ICD-10-CM

## 2018-06-28 MED ORDER — NYSTATIN 100000 UNIT/GM EX POWD: Freq: Three times a day (TID) | CUTANEOUS | 0 refills | Status: DC

## 2018-06-28 MED ORDER — PREDNISONE 10 MG PO TABS: ORAL_TABLET | ORAL | 0 refills | Status: DC

## 2018-06-28 MED ORDER — BENZONATATE 100 MG PO CAPS: 100.0000 mg | ORAL_CAPSULE | Freq: Three times a day (TID) | ORAL | 0 refills | Status: DC | PRN

## 2018-06-28 MED ORDER — HYDROCOD POLST-CPM POLST ER 10-8 MG/5ML PO SUER: 5.0000 mL | Freq: Every evening | ORAL | 0 refills | Status: DC | PRN

## 2018-06-28 NOTE — ED Triage Notes (Signed)
Patient complains of cough, congestion, shortness of breath x 2 days. Patient denies fevers, chills.

## 2018-06-28 NOTE — ED Provider Notes (Signed)
[facility] ____________________________________________  Time seen: Approximately 11:20 AM  I have reviewed the triage vital signs and the nursing notes.   HISTORY  Chief Complaint Cough (APPT)   HPI Susan Swanson is a 22 y.o. female presenting for evaluation of 3 days of runny nose, nasal congestion, cough.  Patient reports history of asthma and has been intermittently hearing herself wheeze, particularly at night.  States cough is worse at night and first thing in the morning.  Cough is disrupting sleep.  Reports intermittent chest tightness and shortness of breath consistent with previous asthma exacerbations.  Denies any atypical shortness of breath.  No accompanying chest pain.  States cough is productive of clear to greenish sputum as well as similar with blowing her nose.  Denies known fevers.  Also had a GI bug last week but is since recovered.  States GI bug going around her house but no other recent sickness at home.  Does work in a daycare.  Has been taken some over-the-counter DayQuil which helps some, no resolution.  Also continuing with home albuterol inhaler which also does help some.  Has continue to remain active.  Denies current abdominal pain, lower extremity swelling, recent immobilization, hemoptysis.  Patient also reports approximate 2 weeks ago she noticed a rash to underneath her lower abdominal fold that is somewhat tender to touch and has continued and also wants evaluated while she is here. Denies known trigger. Denies bites or contact triggers. States similar rash has happened before and eventually went away.   No LMP recorded. Patient has had an injection.Denies pregnancy.    Past Medical History:  Diagnosis Date  . Anxiety   . Asthma   . Syncope     There are no active problems to display for this patient.   Past Surgical History:  Procedure Laterality Date  . CESAREAN SECTION       No current facility-administered medications for  this encounter.   Current Outpatient Medications:  .  albuterol (PROVENTIL HFA;VENTOLIN HFA) 108 (90 Base) MCG/ACT inhaler, 2 puff every 2-4 hours as needed, Disp: , Rfl:  .  medroxyPROGESTERone (DEPO-PROVERA) 150 MG/ML injection, Inject 150 mg into the muscle every 3 (three) months., Disp: , Rfl:  .  benzonatate (TESSALON PERLES) 100 MG capsule, Take 1 capsule (100 mg total) by mouth 3 (three) times daily as needed for cough., Disp: 15 capsule, Rfl: 0 .  chlorpheniramine-HYDROcodone (TUSSIONEX PENNKINETIC ER) 10-8 MG/5ML SUER, Take 5 mLs by mouth at bedtime as needed for cough. do not drive or operate machinery while taking as can cause drowsiness., Disp: 50 mL, Rfl: 0 .  nystatin (MYCOSTATIN/NYSTOP) powder, Apply topically 3 (three) times daily. For 7-10 days, Disp: 15 g, Rfl: 0 .  predniSONE (DELTASONE) 10 MG tablet, Start 60 mg po day one, then 50 mg po day two, taper by 10 mg daily until complete., Disp: 21 tablet, Rfl: 0 .  prochlorperazine (COMPAZINE) 10 MG tablet, Take 1 tablet (10 mg total) by mouth every 6 (six) hours as needed for nausea or vomiting., Disp: 12 tablet, Rfl: 0  Allergies Patient has no known allergies.  Family History  Problem Relation Age of Onset  . Osteoarthritis Mother   . Depression Mother   . Healthy Father     Social History Social History   Tobacco Use  . Smoking status: Current Every Day Smoker    Packs/day: 0.50    Types: Cigarettes  . Smokeless tobacco: Never Used  Substance Use Topics  .  Alcohol use: Yes    Comment: occasionally  . Drug use: Not Currently    Comment: pt states she has a hx of drug addiciton; pt states she used any and everything.     Review of Systems Constitutional: No fever/chills ENT: Some scratchy throat. Cardiovascular: Denies chest pain. Respiratory: As above. Gastrointestinal: No abdominal pain.  No nausea, no vomiting.  No diarrhea.  No constipation. Genitourinary: Negative for dysuria. Skin: Positive for rash.  Denies other rashes.  Neurological: Negative for headaches, focal weakness or numbness.    ____________________________________________   PHYSICAL EXAM:  VITAL SIGNS: ED Triage Vitals  Enc Vitals Group     BP 06/28/18 1008 103/81     Pulse Rate 06/28/18 1008 95     Resp 06/28/18 1008 18     Temp 06/28/18 1008 98.4 F (36.9 C)     Temp Source 06/28/18 1008 Oral     SpO2 06/28/18 1008 99 %     Weight 06/28/18 1005 300 lb (136.1 kg)     Height 06/28/18 1005 5\' 5"  (1.651 m)     Head Circumference --      Peak Flow --      Pain Score 06/28/18 1005 7     Pain Loc --      Pain Edu? --      Excl. in GC? --     Constitutional: Alert and oriented. Well appearing and in no acute distress. Eyes: Conjunctivae are normal.  Head: Atraumatic. No sinus tenderness to palpation. No swelling. No erythema.  Ears: no erythema, normal TMs bilaterally.   Nose:Nasal congestion with clear rhinorrhea  Mouth/Throat: Mucous membranes are moist. No pharyngeal erythema. No tonsillar swelling or exudate.  Neck: No stridor.  No cervical spine tenderness to palpation. Hematological/Lymphatic/Immunilogical: No cervical lymphadenopathy. Cardiovascular: Normal rate, regular rhythm. Grossly normal heart sounds.  Good peripheral circulation. Respiratory: Normal respiratory effort.  No retractions.  Mild scattered inspiratory and expiratory wheezes.  No rhonchi.  No focal area of consolidation.  Dry intermittent cough in room.  Speaks in complete sentences.  Good air movement.  Gastrointestinal: Soft and nontender.  Obese abdomen.  Normal Bowel sounds.  Musculoskeletal: Ambulatory with steady gait.  No lower extremity tenderness or edema noted bilaterally. Neurologic:  Normal speech and language. No gait instability. Skin:  Skin appears warm, dry.  Except: Erythematous rash with satellite lesions to lower abdomen beneath the lower abdominal skin fold, skin intact, no drainage, minimal tenderness during palpation,  abdomen otherwise soft and nontender. Psychiatric: Mood and affect are normal. Speech and behavior are normal.  ___________________________________________   LABS (all labs ordered are listed, but only abnormal results are displayed)  Labs Reviewed - No data to display  PROCEDURES Procedures    INITIAL IMPRESSION / ASSESSMENT AND PLAN / ED COURSE  Pertinent labs & imaging results that were available during my care of the patient were reviewed by me and considered in my medical decision making (see chart for details).  Well-appearing patient.  No acute distress.  Suspect viral upper respiratory infection with asthma exacerbation.  Will treat with prednisone taper, continue home albuterol, PRN Tessalon Perles and PRN Tussionex.  Patient also with rash to lower abdominal skin fold consistent with appearance of skin candidiasis.  Will treat with nystatin powder, counseled regarding keeping clean and dry.  Encourage rest, fluids, supportive care and discussed follow-up and return parameters. Discussed indication, risks and benefits of medications with patient.  Discussed follow up with Primary care physician this  week. Discussed follow up and return parameters including no resolution or any worsening concerns. Patient verbalized understanding and agreed to plan.   ____________________________________________   FINAL CLINICAL IMPRESSION(S) / ED DIAGNOSES  Final diagnoses:  Viral URI with cough  Mild asthma with acute exacerbation, unspecified whether persistent  Skin candidiasis     ED Discharge Orders         Ordered    predniSONE (DELTASONE) 10 MG tablet     06/28/18 1105    chlorpheniramine-HYDROcodone (TUSSIONEX PENNKINETIC ER) 10-8 MG/5ML SUER  At bedtime PRN     06/28/18 1105    benzonatate (TESSALON PERLES) 100 MG capsule  3 times daily PRN     06/28/18 1105    nystatin (MYCOSTATIN/NYSTOP) powder  3 times daily     06/28/18 1105           Note: This dictation was  prepared with Dragon dictation along with smaller phrase technology. Any transcriptional errors that result from this process are unintentional.         Renford Dills, NP 06/28/18 1308

## 2018-06-28 NOTE — Discharge Instructions (Signed)
Take medication as prescribed. Rest. Drink plenty of fluids.  Continue over-the-counter congestion medication.  Keep skin area clean and dry.  Follow up with your primary care physician this week as needed. Return to Urgent care for new or worsening concerns.

## 2018-07-02 ENCOUNTER — Encounter: Payer: Self-pay | Admitting: Emergency Medicine

## 2018-07-02 ENCOUNTER — Ambulatory Visit: Payer: Medicaid Other

## 2018-07-02 ENCOUNTER — Ambulatory Visit
Admission: EM | Admit: 2018-07-02 | Discharge: 2018-07-02 | Disposition: A | Discharge status: Home / Self Care | Payer: Medicaid Other | Attending: Family Medicine | Admitting: Family Medicine

## 2018-07-02 ENCOUNTER — Other Ambulatory Visit: Payer: Self-pay

## 2018-07-02 DIAGNOSIS — F17210 Nicotine dependence, cigarettes, uncomplicated: Secondary | ICD-10-CM | POA: Diagnosis not present

## 2018-07-02 DIAGNOSIS — J45901 Unspecified asthma with (acute) exacerbation: Secondary | ICD-10-CM | POA: Diagnosis not present

## 2018-07-02 DIAGNOSIS — S9032XA Contusion of left foot, initial encounter: Secondary | ICD-10-CM | POA: Diagnosis not present

## 2018-07-02 DIAGNOSIS — X58XXXA Exposure to other specified factors, initial encounter: Secondary | ICD-10-CM

## 2018-07-02 DIAGNOSIS — S99911A Unspecified injury of right ankle, initial encounter: Secondary | ICD-10-CM | POA: Diagnosis present

## 2018-07-02 DIAGNOSIS — J0100 Acute maxillary sinusitis, unspecified: Secondary | ICD-10-CM

## 2018-07-02 DIAGNOSIS — F419 Anxiety disorder, unspecified: Secondary | ICD-10-CM | POA: Diagnosis not present

## 2018-07-02 DIAGNOSIS — Z79899 Other long term (current) drug therapy: Secondary | ICD-10-CM | POA: Diagnosis not present

## 2018-07-02 DIAGNOSIS — X501XXA Overexertion from prolonged static or awkward postures, initial encounter: Secondary | ICD-10-CM | POA: Diagnosis not present

## 2018-07-02 DIAGNOSIS — R059 Cough, unspecified: Secondary | ICD-10-CM

## 2018-07-02 DIAGNOSIS — R05 Cough: Secondary | ICD-10-CM | POA: Diagnosis present

## 2018-07-02 DIAGNOSIS — S93401A Sprain of unspecified ligament of right ankle, initial encounter: Secondary | ICD-10-CM | POA: Diagnosis not present

## 2018-07-02 MED ORDER — BENZONATATE 200 MG PO CAPS: 200.0000 mg | ORAL_CAPSULE | Freq: Three times a day (TID) | ORAL | 0 refills | Status: DC | PRN

## 2018-07-02 MED ORDER — DOXYCYCLINE HYCLATE 100 MG PO CAPS: 100.0000 mg | ORAL_CAPSULE | Freq: Two times a day (BID) | ORAL | 0 refills | Status: DC

## 2018-07-02 MED ORDER — ALBUTEROL SULFATE HFA 108 (90 BASE) MCG/ACT IN AERS: 2.0000 | INHALATION_SPRAY | RESPIRATORY_TRACT | 0 refills | Status: DC | PRN

## 2018-07-02 MED ORDER — PREDNISONE 20 MG PO TABS: 40.0000 mg | ORAL_TABLET | Freq: Every day | ORAL | 0 refills | Status: DC

## 2018-07-02 NOTE — Discharge Instructions (Addendum)
Take medication as prescribed. Rest. Drink plenty of fluids.  Over-the-counter Mucinex as needed.  Wear brace as needed for support.  Gradual increase activity.  Follow-up with orthopedic as needed for continued pain.  Follow up with your primary care physician this week as needed. Return to Urgent care for new or worsening concerns.

## 2018-07-02 NOTE — ED Provider Notes (Signed)
[facility] ____________________________________________  Time seen: Approximately 8:41 AM  I have reviewed the triage vital signs and the nursing notes.   HISTORY  Chief Complaint Cough (APPT) and Ankle Injury (right DOI 07/01/18)   HPI Susan Swanson is a 22 y.o. female presenting for evaluation and reevaluation of continued cough and congestion complaints as well as right ankle pain and left foot pain.  Presenting for complaints of approximately 1 week of runny nose, nasal congestion, cough, chest congestion and intermittent wheezing.  Patient seen in urgent care 4 days ago for the same complaints.  Reports cough continues though nasal congestion has slightly improved.  Cough is mostly dry hacking cough occasionally productive.  Intermittently herself wheeze and using home albuterol inhaler.  States that she was placed on prednisone and has 1 day left.  States the nighttime cough syrup works well for cough but continues coughing throughout the day.  Denies known fevers.  Family recently sick with similar complaints.  Continues to eat and drink well.  No chest pain.  States does have intermittent shortness of breath which she describes as chest tightness with wheezing and consistent with previous asthma exacerbations.  Denies any atypical shortness of breath.  Has continue to remain active.  Not taken other over-the-counter medications.  Also presenting for evaluation of pain to right ankle and left foot after miss stepping and falling off of a stepladder yesterday.  States that she rolled her right ankle and hit her left foot.  Denies any other pain or injuries from the fall.  Has continue to weight-bear though painful.  Did apply some ice once, no other alleviating measures attempted.  Pain worse with direct palpation and walking.  Denies other aggravating factors.  Denies pain radiation, paresthesias, other injuries.  Reports otherwise doing well.  No LMP recorded. Patient has  had an injection.Denies pregnancy.    Past Medical History:  Diagnosis Date  . Anxiety   . Asthma   . Syncope     There are no active problems to display for this patient.   Past Surgical History:  Procedure Laterality Date  . CESAREAN SECTION       No current facility-administered medications for this encounter.   Current Outpatient Medications:  .  albuterol (PROVENTIL HFA;VENTOLIN HFA) 108 (90 Base) MCG/ACT inhaler, 2 puff every 2-4 hours as needed, Disp: , Rfl:  .  chlorpheniramine-HYDROcodone (TUSSIONEX PENNKINETIC ER) 10-8 MG/5ML SUER, Take 5 mLs by mouth at bedtime as needed for cough. do not drive or operate machinery while taking as can cause drowsiness., Disp: 50 mL, Rfl: 0 .  medroxyPROGESTERone (DEPO-PROVERA) 150 MG/ML injection, Inject 150 mg into the muscle every 3 (three) months., Disp: , Rfl:  .  nystatin (MYCOSTATIN/NYSTOP) powder, Apply topically 3 (three) times daily. For 7-10 days, Disp: 15 g, Rfl: 0 .  predniSONE (DELTASONE) 10 MG tablet, Start 60 mg po day one, then 50 mg po day two, taper by 10 mg daily until complete., Disp: 21 tablet, Rfl: 0 .  albuterol (PROVENTIL HFA;VENTOLIN HFA) 108 (90 Base) MCG/ACT inhaler, Inhale 2 puffs into the lungs every 4 (four) hours as needed., Disp: 1 Inhaler, Rfl: 0 .  benzonatate (TESSALON) 200 MG capsule, Take 1 capsule (200 mg total) by mouth 3 (three) times daily as needed for cough., Disp: 20 capsule, Rfl: 0 .  doxycycline (VIBRAMYCIN) 100 MG capsule, Take 1 capsule (100 mg total) by mouth 2 (two) times daily., Disp: 20 capsule, Rfl: 0 .  predniSONE (DELTASONE)  20 MG tablet, Take 2 tablets (40 mg total) by mouth daily., Disp: 10 tablet, Rfl: 0  Allergies Patient has no known allergies.  Family History  Problem Relation Age of Onset  . Osteoarthritis Mother   . Depression Mother   . Healthy Father     Social History Social History   Tobacco Use  . Smoking status: Current Every Day Smoker    Packs/day: 0.50     Types: Cigarettes  . Smokeless tobacco: Never Used  Substance Use Topics  . Alcohol use: Yes    Comment: occasionally  . Drug use: Not Currently    Comment: pt states she has a hx of drug addiciton; pt states she used any and everything.     Review of Systems Constitutional: No fever Eyes: No visual changes. ENT: No sore throat. As above.  Cardiovascular: Denies chest pain. Respiratory: Denies shortness of breath. Gastrointestinal: No abdominal pain.   Musculoskeletal: Negative for back pain. Neurological: Negative for focal weakness or numbness.  ____________________________________________   PHYSICAL EXAM:  VITAL SIGNS: ED Triage Vitals  Enc Vitals Group     BP 07/02/18 0823 121/86     Pulse Rate 07/02/18 0823 95     Resp 07/02/18 0823 20     Temp 07/02/18 0823 97.7 F (36.5 C)     Temp Source 07/02/18 0823 Oral     SpO2 07/02/18 0823 99 %     Weight 07/02/18 0824 300 lb (136.1 kg)     Height 07/02/18 0824 5\' 5"  (1.651 m)     Head Circumference --      Peak Flow --      Pain Score 07/02/18 0824 8     Pain Loc --      Pain Edu? --      Excl. in GC? --     Constitutional: Alert and oriented. Well appearing and in no acute distress. Eyes: Conjunctivae are normal.  Head: Atraumatic.Mild to moderate tenderness to palpation bilateral maxillary sinuses.  No frontal sinus tenderness palpation.  No swelling. No erythema.   Ears: no erythema, normal TMs bilaterally.   Nose: nasal congestion with bilateral nasal turbinate erythema and edema.   Mouth/Throat: Mucous membranes are moist.  Oropharynx non-erythematous.No tonsillar swelling or exudate.  Neck: No stridor.  No cervical spine tenderness to palpation. Hematological/Lymphatic/Immunilogical: No cervical lymphadenopathy. Cardiovascular: Normal rate, regular rhythm. Grossly normal heart sounds.  Good peripheral circulation. Respiratory: Normal respiratory effort.  No retractions. Good air movement.  Mild scattered  expiratory wheezes.  Mild scattered rhonchi.  No focal area of consolidation.  Speaks in complete sentences.  Occasional dry cough noted. Musculoskeletal: Bilateral pedal pulses equal and easily palpated. No cervical, thoracic or lumbar tenderness to palpation.  Ambulatory in room with mild antalgic gait. Except: Right lateral malleolus mild to moderate tenderness palpation with mild localized swelling and mild immediate anterior tenderness, minimal medial malleolus tenderness palpation, right foot and right ankle otherwise nontender, right lower extremity otherwise nontender, normal distal sensation and capillary refill, pain with ankle rotation but good range of motion present. Except: Left dorsal proximal to the midfoot mild to moderate tenderness to direct palpation, mild erythema, no edema, distal sensation intact, pain with plantarflexion and dorsiflexion with good range of motion present, left lower extremity otherwise nontender. Neurologic:  Normal speech and language. No gait instability. Skin:  Skin is warm, dry Psychiatric: Mood and affect are normal. Speech and behavior are normal.  ___________________________________________   LABS (all labs ordered are listed,  but only abnormal results are displayed)  Labs Reviewed - No data to display ____________________________________________ RADIOLOGY  Dg Ankle Complete Right  Result Date: 07/02/2018 CLINICAL DATA:  Fall, twisted right ankle. EXAM: RIGHT ANKLE - COMPLETE 3+ VIEW COMPARISON:  None. FINDINGS: Diffuse soft tissue swelling. No acute bony abnormality. Specifically, no fracture, subluxation, or dislocation. IMPRESSION: No acute bony abnormality. Electronically Signed   By: Charlett Nose M.D.   On: 07/02/2018 09:10   Dg Foot Complete Left  Result Date: 07/02/2018 CLINICAL DATA: Injury after fall. EXAM: LEFT FOOT - COMPLETE 3+ VIEW COMPARISON:  02/19/2016. FINDINGS: No acute bony or joint abnormality identified. No evidence of  fracture or dislocation. IMPRESSION: No acute abnormality. Electronically Signed   By: Maisie Fus  Register   On: 07/02/2018 09:13   ____________________________________________   PROCEDURES Procedures    INITIAL IMPRESSION / ASSESSMENT AND PLAN / ED COURSE  Pertinent labs & imaging results that were available during my care of the patient were reviewed by me and considered in my medical decision making (see chart for details).  Well-appearing patient.  No acute distress.  Patient continued with upper respiratory symptoms and continued intermittent wheezing.  Appears consistent with asthma exacerbation.  No focal area of consolidation auscultated, patient denies any fevers, will defer chest x-ray at this time, patient agrees.  Will treat with continued course of prednisone, refill of albuterol inhaler given, doxycycline and Rx 200 mg Tessalon Perles.  Continue home Tussionex as needed.  Over-the-counter Mucinex.  Right ankle and left foot x-rays as above per radiologist, no acute bony abnormality.  Suspect contusion and sprain injuries.  Right ankle stirrup splint given for support.  Gradual increase activity, ice as needed.  Follow-up with orthopedic as needed.  Continue pain.  Encourage rest, fluids, supportive care. Discussed indication, risks and benefits of medications with patient.    Discussed follow up with Primary care physician this week. Discussed follow up and return parameters including no resolution or any worsening concerns. Patient verbalized understanding and agreed to plan.   ____________________________________________   FINAL CLINICAL IMPRESSION(S) / ED DIAGNOSES  Final diagnoses:  Asthma with acute exacerbation, unspecified asthma severity, unspecified whether persistent  Acute maxillary sinusitis, recurrence not specified  Cough  Sprain of right ankle, unspecified ligament, initial encounter  Contusion of left foot, initial encounter     ED Discharge Orders          Ordered    predniSONE (DELTASONE) 20 MG tablet  Daily     07/02/18 0845    albuterol (PROVENTIL HFA;VENTOLIN HFA) 108 (90 Base) MCG/ACT inhaler  Every 4 hours PRN     07/02/18 0845    doxycycline (VIBRAMYCIN) 100 MG capsule  2 times daily     07/02/18 0845    benzonatate (TESSALON) 200 MG capsule  3 times daily PRN     07/02/18 0845           Note: This dictation was prepared with Dragon dictation along with smaller phrase technology. Any transcriptional errors that result from this process are unintentional.         Renford Dills, NP 07/02/18 [phone]

## 2018-07-02 NOTE — ED Triage Notes (Signed)
Patient in today c/o worsening cough and pain when she coughs. Patient was seen 06/28/18 and treated for viral URI.  Patient also c/o right ankle pain after falling off a ladder on 07/01/18.

## 2018-07-08 ENCOUNTER — Other Ambulatory Visit: Payer: Self-pay

## 2018-07-08 ENCOUNTER — Ambulatory Visit: Payer: Medicaid Other

## 2018-07-08 ENCOUNTER — Ambulatory Visit
Admission: EM | Admit: 2018-07-08 | Discharge: 2018-07-08 | Disposition: A | Discharge status: Home / Self Care | Payer: Medicaid Other | Attending: Family Medicine | Admitting: Family Medicine

## 2018-07-08 DIAGNOSIS — M940 Chondrocostal junction syndrome [Tietze]: Secondary | ICD-10-CM | POA: Diagnosis not present

## 2018-07-08 DIAGNOSIS — R05 Cough: Secondary | ICD-10-CM | POA: Diagnosis present

## 2018-07-08 DIAGNOSIS — R0602 Shortness of breath: Secondary | ICD-10-CM | POA: Insufficient documentation

## 2018-07-08 DIAGNOSIS — R059 Cough, unspecified: Secondary | ICD-10-CM

## 2018-07-08 DIAGNOSIS — F17210 Nicotine dependence, cigarettes, uncomplicated: Secondary | ICD-10-CM | POA: Diagnosis not present

## 2018-07-08 MED ORDER — BECLOMETHASONE DIPROP HFA 80 MCG/ACT IN AERB: 1.0000 | INHALATION_SPRAY | Freq: Two times a day (BID) | RESPIRATORY_TRACT | 1 refills | Status: DC

## 2018-07-08 MED ORDER — MELOXICAM 15 MG PO TABS: 15.0000 mg | ORAL_TABLET | Freq: Every day | ORAL | 0 refills | Status: DC | PRN

## 2018-07-08 NOTE — Discharge Instructions (Signed)
Use the medications as prescribed.  Continue to give it time.  Take care  Dr. Adriana Simas

## 2018-07-08 NOTE — ED Provider Notes (Signed)
[facility]    CSN: [id number] Arrival date & time: 07/08/18  0910  History   Chief Complaint Chief Complaint  Patient presents with  . Cough   HPI  22 year old female presents with persistent cough as well as pain with breathing.  This is the patient's third evaluation for similar complaints since 10/28.  She has been prescribed 2 courses of prednisone, doxycycline, Tussionex, Tessalon Perles, albuterol.  Patient states that she still does not feel well.  She states that her cough has improved and is quite well controlled during the day.  Worse at night.  Patient's predominant complaint is feelings of shortness of breath.  This is primarily due to the fact that she states that it is painful when she breathes.  She reports that her chest is sore.  No fever.  Cough is nonproductive.  She has currently stopped smoking.  No other associated symptoms.  No other complaints.  Social history reviewed and updated as below. Social History Social History   Tobacco Use  . Smoking status: Current Every Day Smoker    Packs/day: 0.50    Types: Cigarettes  . Smokeless tobacco: Never Used  Substance Use Topics  . Alcohol use: Yes    Comment: occasionally  . Drug use: Not Currently    Comment: pt states she has a hx of drug addiciton; pt states she used any and everything.     Allergies   Patient has no known allergies.   Review of Systems Review of Systems  Constitutional: Negative for fever.  Respiratory: Positive for cough, chest tightness and shortness of breath.   Musculoskeletal:       Chest wall/rib pain.    Physical Exam Triage Vital Signs ED Triage Vitals  Enc Vitals Group     BP 07/08/18 0921 111/64     Pulse Rate 07/08/18 0921 88     Resp 07/08/18 0921 18     Temp 07/08/18 0921 97.8 F (36.6 C)     Temp Source 07/08/18 0921 Oral     SpO2 07/08/18 0921 100 %     Weight 07/08/18 0920 300 lb (136.1 kg)     Height 07/08/18 0920 5\' 5"  (1.651 m)     Head  Circumference --      Peak Flow --      Pain Score 07/08/18 0920 8     Pain Loc --      Pain Edu? --      Excl. in GC? --    Updated Vital Signs BP 111/64 (BP Location: Left Arm)   Pulse 88   Temp 97.8 F (36.6 C) (Oral)   Resp 18   Ht 5\' 5"  (1.651 m)   Wt 136.1 kg   SpO2 100%   BMI 49.92 kg/m   Visual Acuity Right Eye Distance:   Left Eye Distance:   Bilateral Distance:    Right Eye Near:   Left Eye Near:    Bilateral Near:     Physical Exam  Constitutional: She is oriented to person, place, and time. She appears well-developed. No distress.  HENT:  Head: Normocephalic and atraumatic.  Cardiovascular: Normal rate and regular rhythm.  Pulmonary/Chest: Effort normal. No respiratory distress. She exhibits tenderness.  Faint expiratory wheezing.  Neurological: She is alert and oriented to person, place, and time.  Psychiatric: She has a normal mood and affect. Her behavior is normal.  Nursing note and vitals reviewed.  UC Treatments / Results  Labs (all labs ordered  are listed, but only abnormal results are displayed) Labs Reviewed - No data to display  EKG None  Radiology Dg Chest 2 View  Result Date: 07/08/2018 CLINICAL DATA:  Persistent cough and shortness of breath EXAM: CHEST - 2 VIEW COMPARISON:  11/01/2016 FINDINGS: Normal heart size and mediastinal contours. No acute infiltrate or edema. No effusion or pneumothorax. No acute osseous findings. IMPRESSION: Negative chest. Electronically Signed   By: Marnee Spring M.D.   On: 07/08/2018 10:03    Procedures Procedures (including critical care time)  Medications Ordered in UC Medications - No data to display  Initial Impression / Assessment and Plan / UC Course  I have reviewed the triage vital signs and the nursing notes.  Pertinent labs & imaging results that were available during my care of the patient were reviewed by me and considered in my medical decision making (see chart for details).    22  year old female presents with persistent cough.  Chest x-ray obtained and was negative.  Seems to be experiencing costochondritis.  Given asthma, tobacco abuse and persistent symptoms, I am placing her on Qvar in addition albuterol.  Meloxicam for her costochondritis/chest wall pain.  Final Clinical Impressions(s) / UC Diagnoses   Final diagnoses:  Cough  Costochondritis     Discharge Instructions     Use the medications as prescribed.  Continue to give it time.  Take care  Dr. Adriana Simas     ED Prescriptions    Medication Sig Dispense Auth. Provider   meloxicam (MOBIC) 15 MG tablet Take 1 tablet (15 mg total) by mouth daily as needed. 30 tablet Kyheem Bathgate G, DO   beclomethasone (QVAR) 80 MCG/ACT inhaler Inhale 1 puff into the lungs 2 (two) times daily. 1 Inhaler Tommie Sams, DO     Controlled Substance Prescriptions [place] Controlled Substance Registry consulted? Not Applicable   Tommie Sams, DO 07/08/18 1028

## 2018-07-08 NOTE — ED Triage Notes (Signed)
Patient complains of cough, body soreness and coughing throughout the night. Patient states that she still feels like she cannot breathe. Patient reports that she has not noticed any improvement from current medications.

## 2018-08-09 ENCOUNTER — Other Ambulatory Visit: Payer: Self-pay

## 2018-08-09 ENCOUNTER — Encounter: Payer: Self-pay | Admitting: Emergency Medicine

## 2018-08-09 ENCOUNTER — Ambulatory Visit
Admission: EM | Admit: 2018-08-09 | Discharge: 2018-08-09 | Disposition: A | Discharge status: Home / Self Care | Payer: Medicaid Other | Attending: Family Medicine | Admitting: Family Medicine

## 2018-08-09 DIAGNOSIS — J45901 Unspecified asthma with (acute) exacerbation: Secondary | ICD-10-CM

## 2018-08-09 DIAGNOSIS — R05 Cough: Secondary | ICD-10-CM

## 2018-08-09 MED ORDER — HYDROCOD POLST-CPM POLST ER 10-8 MG/5ML PO SUER: 5.0000 mL | Freq: Every evening | ORAL | 0 refills | Status: DC | PRN

## 2018-08-09 MED ORDER — ALBUTEROL SULFATE (2.5 MG/3ML) 0.083% IN NEBU
5.0000 mg | INHALATION_SOLUTION | Freq: Once | RESPIRATORY_TRACT | Status: AC
  Administered 2018-08-09: 5 mg via RESPIRATORY_TRACT

## 2018-08-09 MED ORDER — PREDNISONE 50 MG PO TABS: ORAL_TABLET | ORAL | 0 refills | Status: DC

## 2018-08-09 MED ORDER — METHYLPREDNISOLONE SODIUM SUCC 125 MG IJ SOLR
125.0000 mg | Freq: Once | INTRAMUSCULAR | Status: AC
  Administered 2018-08-09: 125 mg via INTRAMUSCULAR

## 2018-08-09 MED ORDER — BUDESONIDE-FORMOTEROL FUMARATE 80-4.5 MCG/ACT IN AERO: 2.0000 | INHALATION_SPRAY | Freq: Two times a day (BID) | RESPIRATORY_TRACT | 12 refills | Status: DC

## 2018-08-09 NOTE — ED Triage Notes (Signed)
Patient c/o cough and congestion that started 1 month ago. She stated she was seen 1 month ago for the same symptoms.

## 2018-08-09 NOTE — Discharge Instructions (Signed)
Meds as prescribed.  If you worsen, go to the hospital.  Take care  Dr. Adriana Simasook

## 2018-08-09 NOTE — ED Provider Notes (Signed)
[facility]    CSN: [id number] Arrival date & time: 08/09/18  1606  History   Chief Complaint Chief Complaint  Patient presents with  . Cough    APPT   HPI  22 year old female presents with cough.  Patient has known asthma.  She is had ongoing issues with respiratory infections and asthma exacerbations for quite some time now.  I saw the patient for this on 11/7.  She states that she improved but then worsened again 4 to 5 days ago.  She reports severe cough, congestion, and associated shortness of breath.  Difficulty breathing.  She has had no improvement with albuterol.  She states that she has now out of her Qvar inhaler.  Symptoms are severe.  No exacerbating factors.  No other associated symptoms.  No complaints.  PMH, Surgical Hx, Family Hx, Social History reviewed and updated as below.  Past Medical History:  Diagnosis Date  . Anxiety   . Asthma   . Syncope    Past Surgical History:  Procedure Laterality Date  . CESAREAN SECTION     OB History   None    Home Medications    Prior to Admission medications   Medication Sig Start Date End Date Taking? Authorizing Provider  albuterol (PROVENTIL HFA;VENTOLIN HFA) 108 (90 Base) MCG/ACT inhaler Inhale 2 puffs into the lungs every 4 (four) hours as needed. 07/02/18  Yes Renford DillsMiller, Lindsey, NP  medroxyPROGESTERone (DEPO-PROVERA) 150 MG/ML injection Inject 150 mg into the muscle every 3 (three) months.   Yes [provider]  budesonide-formoterol (SYMBICORT) 80-4.5 MCG/ACT inhaler Inhale 2 puffs into the lungs 2 (two) times daily. 08/09/18   Tommie Samsook, Jahred Tatar G, DO  chlorpheniramine-HYDROcodone (TUSSIONEX PENNKINETIC ER) 10-8 MG/5ML SUER Take 5 mLs by mouth at bedtime as needed. 08/09/18   Tommie Samsook, Kalila Adkison G, DO  predniSONE (DELTASONE) 50 MG tablet 1 tablet daily x 5 days 08/09/18   Tommie Samsook, Estephanie Hubbs G, DO    Family History Family History  Problem Relation Age of Onset  . Osteoarthritis Mother   . Depression Mother   .  Healthy Father     Social History Social History   Tobacco Use  . Smoking status: Current Every Day Smoker    Packs/day: 0.50    Types: Cigarettes  . Smokeless tobacco: Never Used  Substance Use Topics  . Alcohol use: Yes    Comment: occasionally  . Drug use: Not Currently    Comment: pt states she has a hx of drug addiciton; pt states she used any and everything.      Allergies   Patient has no known allergies.   Review of Systems Review of Systems  Constitutional: Negative for fever.  Respiratory: Positive for cough, chest tightness, shortness of breath and wheezing.    Physical Exam Triage Vital Signs ED Triage Vitals  Enc Vitals Group     BP 08/09/18 1633 (!) 132/100     Pulse Rate 08/09/18 1633 91     Resp 08/09/18 1633 18     Temp 08/09/18 1633 98.3 F (36.8 C)     Temp Source 08/09/18 1633 Oral     SpO2 08/09/18 1633 100 %     Weight 08/09/18 1633 300 lb (136.1 kg)     Height 08/09/18 1633 5\' 5"  (1.651 m)     Head Circumference --      Peak Flow --      Pain Score 08/09/18 1632 0     Pain Loc --  Pain Edu? --      Excl. in GC? --    Updated Vital Signs BP (!) 132/100 (BP Location: Left Arm)   Pulse 91   Temp 98.3 F (36.8 C) (Oral)   Resp 18   Ht 5\' 5"  (1.651 m)   Wt 136.1 kg   SpO2 100%   BMI 49.92 kg/m   Visual Acuity Right Eye Distance:   Left Eye Distance:   Bilateral Distance:    Right Eye Near:   Left Eye Near:    Bilateral Near:     Physical Exam  Constitutional: She appears well-developed.  No acute respiratory distress.  HENT:  Head: Normocephalic and atraumatic.  Mouth/Throat: Oropharynx is clear and moist.  Cardiovascular: Normal rate and regular rhythm.  Pulmonary/Chest: Effort normal.  Expiratory wheezing.  Neurological: She is alert.  Psychiatric: She has a normal mood and affect. Her behavior is normal.  Nursing note and vitals reviewed.  UC Treatments / Results  Labs (all labs ordered are listed, but only  abnormal results are displayed) Labs Reviewed - No data to display  EKG None  Radiology No results found.  Procedures Procedures (including critical care time)  Medications Ordered in UC Medications  albuterol (PROVENTIL) (2.5 MG/3ML) 0.083% nebulizer solution 5 mg (5 mg Nebulization Given 08/09/18 1745)  methylPREDNISolone sodium succinate (SOLU-MEDROL) 125 mg/2 mL injection 125 mg (125 mg Intramuscular Given 08/09/18 1742)    Initial Impression / Assessment and Plan / UC Course  I have reviewed the triage vital signs and the nursing notes.  Pertinent labs & imaging results that were available during my care of the patient were reviewed by me and considered in my medical decision making (see chart for details).    22 year old female presents with asthma exacerbation.  Treating with Solu-Medrol.  Albuterol given today with some improvement.  Discharged home on prednisone, Tussionex.  Also starting the patient on Symbicort.  Final Clinical Impressions(s) / UC Diagnoses   Final diagnoses:  Exacerbation of asthma, unspecified asthma severity, unspecified whether persistent     Discharge Instructions     Meds as prescribed.  If you worsen, go to the hospital.  Take care  Dr. Adriana Simas    ED Prescriptions    Medication Sig Dispense Auth. Provider   predniSONE (DELTASONE) 50 MG tablet 1 tablet daily x 5 days 5 tablet Naja Apperson G, DO   chlorpheniramine-HYDROcodone (TUSSIONEX PENNKINETIC ER) 10-8 MG/5ML SUER Take 5 mLs by mouth at bedtime as needed. 60 mL Loras Grieshop G, DO   budesonide-formoterol (SYMBICORT) 80-4.5 MCG/ACT inhaler Inhale 2 puffs into the lungs 2 (two) times daily. 1 Inhaler Tommie Sams, DO     Controlled Substance Prescriptions [place] Controlled Substance Registry consulted? Not Applicable   Tommie Sams, DO 08/09/18 2056

## 2018-08-12 DIAGNOSIS — E66813 Obesity, class 3: Secondary | ICD-10-CM | POA: Insufficient documentation

## 2018-08-12 DIAGNOSIS — Z6841 Body Mass Index (BMI) 40.0 and over, adult: Secondary | ICD-10-CM

## 2018-10-07 DIAGNOSIS — Z6841 Body Mass Index (BMI) 40.0 and over, adult: Secondary | ICD-10-CM

## 2018-10-07 DIAGNOSIS — IMO0002 Reserved for concepts with insufficient information to code with codable children: Secondary | ICD-10-CM

## 2018-10-07 DIAGNOSIS — Z915 Personal history of self-harm: Secondary | ICD-10-CM

## 2018-10-07 DIAGNOSIS — Z8709 Personal history of other diseases of the respiratory system: Secondary | ICD-10-CM

## 2018-10-07 DIAGNOSIS — Z8619 Personal history of other infectious and parasitic diseases: Secondary | ICD-10-CM

## 2018-11-09 ENCOUNTER — Ambulatory Visit
Admission: EM | Admit: 2018-11-09 | Discharge: 2018-11-09 | Disposition: A | Discharge status: Home / Self Care | Payer: Medicaid Other | Attending: Family Medicine | Admitting: Family Medicine

## 2018-11-09 ENCOUNTER — Encounter: Payer: Self-pay | Admitting: Emergency Medicine

## 2018-11-09 ENCOUNTER — Other Ambulatory Visit: Payer: Self-pay

## 2018-11-09 DIAGNOSIS — B9789 Other viral agents as the cause of diseases classified elsewhere: Secondary | ICD-10-CM | POA: Insufficient documentation

## 2018-11-09 DIAGNOSIS — J069 Acute upper respiratory infection, unspecified: Secondary | ICD-10-CM | POA: Diagnosis not present

## 2018-11-09 DIAGNOSIS — Z87891 Personal history of nicotine dependence: Secondary | ICD-10-CM | POA: Diagnosis not present

## 2018-11-09 LAB — RAPID INFLUENZA A&B ANTIGENS
Influenza A (ARMC): NEGATIVE
Influenza B (ARMC): NEGATIVE

## 2018-11-09 LAB — RAPID STREP SCREEN (MED CTR MEBANE ONLY): Streptococcus, Group A Screen (Direct): NEGATIVE

## 2018-11-09 MED ORDER — HYDROCOD POLST-CPM POLST ER 10-8 MG/5ML PO SUER: 5.0000 mL | Freq: Every evening | ORAL | 0 refills | Status: DC | PRN

## 2018-11-09 MED ORDER — IPRATROPIUM BROMIDE 0.06 % NA SOLN: 2.0000 | Freq: Four times a day (QID) | NASAL | 0 refills | Status: DC | PRN

## 2018-11-09 MED ORDER — BENZONATATE 100 MG PO CAPS: 100.0000 mg | ORAL_CAPSULE | Freq: Three times a day (TID) | ORAL | 0 refills | Status: DC | PRN

## 2018-11-09 MED ORDER — PREDNISONE 50 MG PO TABS: ORAL_TABLET | ORAL | 0 refills | Status: DC

## 2018-11-09 NOTE — ED Triage Notes (Signed)
Patient c/o cough, sore throat, ear pain and bodyaches for the past 2 days.  Patient denies fevers.

## 2018-11-09 NOTE — Discharge Instructions (Signed)
Medications as prescribed.  Continue use of your albuterol.  Take care  Dr. Adriana Simas

## 2018-11-09 NOTE — ED Provider Notes (Addendum)
[facility]    CSN: [id number] Arrival date & time: 11/09/18  1133  History   Chief Complaint Chief Complaint  Patient presents with  . Cough   HPI  23 year old female presents with respiratory symptoms.  Patient reports that she has been sick for the past 2 to 3 days.  She reports cough, sore throat, ear pain, body aches.  No documented fever.  She has been using albuterol without resolution.  Children are also sick at home.  No known exacerbating factors.  No other associated symptoms.  No other complaints.  PMH, Surgical Hx, Family Hx, Social History reviewed and updated as below.  Past Medical History:  Diagnosis Date  . Anxiety   . Asthma   . Syncope    Patient Active Problem List   Diagnosis Date Noted  . Morbid obesity with BMI of 50.0-59.9, adult (HCC) 08/12/2018  . History of herpes genitalis 12/25/2017  . History of asthma 06/16/2017  . Hx of self-harm 06/16/2017   Past Surgical History:  Procedure Laterality Date  . CESAREAN SECTION     OB History   No obstetric history on file.     Home Medications    Prior to Admission medications   Medication Sig Start Date End Date Taking? Authorizing Provider  albuterol (PROVENTIL HFA;VENTOLIN HFA) 108 (90 Base) MCG/ACT inhaler Inhale 2 puffs into the lungs every 4 (four) hours as needed. 07/02/18  Yes Renford Dills, NP  budesonide-formoterol [facility]) 80-4.5 MCG/ACT inhaler Inhale 2 puffs into the lungs 2 (two) times daily. 08/09/18  Yes Anahid Eskelson G, DO  norethindrone (JENCYCLA) 0.35 MG tablet Take 1 tablet by mouth daily.   Yes [provider]  benzonatate (TESSALON) 100 MG capsule Take 1 capsule (100 mg total) by mouth 3 (three) times daily as needed. 11/09/18   Tommie Sams, DO  chlorpheniramine-HYDROcodone (TUSSIONEX PENNKINETIC ER) 10-8 MG/5ML SUER Take 5 mLs by mouth at bedtime as needed. 11/09/18   Everlene Other G, DO  ipratropium (ATROVENT) 0.06 % nasal spray Place 2 sprays into both  nostrils 4 (four) times daily as needed for rhinitis. 11/09/18   Tommie Sams, DO  predniSONE (DELTASONE) 50 MG tablet 1 tablet daily x 5 days 11/09/18   Tommie Sams, DO    Family History Family History  Problem Relation Age of Onset  . Osteoarthritis Mother   . Depression Mother   . Healthy Father     Social History Social History   Tobacco Use  . Smoking status: Former Smoker    Packs/day: 0.50    Types: Cigarettes  . Smokeless tobacco: Never Used  Substance Use Topics  . Alcohol use: Yes    Comment: occasionally  . Drug use: Not Currently    Comment: pt states she has a hx of drug addiciton; pt states she used any and everything.      Allergies   Patient has no known allergies.   Review of Systems Review of Systems  Constitutional: Negative for fever.  HENT: Positive for ear pain and sore throat.   Respiratory: Positive for cough.   Musculoskeletal:       Body aches.   Physical Exam Triage Vital Signs ED Triage Vitals  Enc Vitals Group     BP 11/09/18 1147 108/81     Pulse Rate 11/09/18 1147 98     Resp 11/09/18 1147 14     Temp 11/09/18 1147 97.6 F (36.4 C)     Temp Source 11/09/18 1147  Oral     SpO2 11/09/18 1147 100 %     Weight 11/09/18 1143 300 lb (136.1 kg)     Height 11/09/18 1143 5\' 5"  (1.651 m)     Head Circumference --      Peak Flow --      Pain Score 11/09/18 1143 7     Pain Loc --      Pain Edu? --      Excl. in GC? --    Updated Vital Signs BP 108/81 (BP Location: Left Arm)   Pulse 98   Temp 97.6 F (36.4 C) (Oral)   Resp 14   Ht 5\' 5"  (1.651 m)   Wt 136.1 kg   LMP 11/02/2018 (Approximate)   SpO2 100%   BMI 49.92 kg/m   Visual Acuity Right Eye Distance:   Left Eye Distance:   Bilateral Distance:    Right Eye Near:   Left Eye Near:    Bilateral Near:     Physical Exam Vitals signs and nursing note reviewed.  Constitutional:      General: She is not in acute distress.    Appearance: Normal appearance.  HENT:      Head: Normocephalic and atraumatic.     Nose: Congestion present.     Mouth/Throat:     Pharynx: Posterior oropharyngeal erythema present. No oropharyngeal exudate.  Eyes:     General:        Right eye: No discharge.        Left eye: No discharge.     Conjunctiva/sclera: Conjunctivae normal.  Cardiovascular:     Rate and Rhythm: Normal rate and regular rhythm.  Pulmonary:     Effort: Pulmonary effort is normal.     Breath sounds: Normal breath sounds.  Neurological:     Mental Status: She is alert.  Psychiatric:        Mood and Affect: Mood normal.        Behavior: Behavior normal.    UC Treatments / Results  Labs (all labs ordered are listed, but only abnormal results are displayed) Labs Reviewed  RAPID INFLUENZA A&B ANTIGENS ([facility] ONLY)  RAPID STREP SCREEN ([facility] ONLY)  CULTURE, GROUP A STREP [facility])    EKG None  Radiology No results found.  Procedures Procedures (including critical care time)  Medications Ordered in UC Medications - No data to display  Initial Impression / Assessment and Plan / UC Course  I have reviewed the triage vital signs and the nursing notes.  Pertinent labs & imaging results that were available during my care of the patient were reviewed by me and considered in my medical decision making (see chart for details).    23 year old female presents with a viral URI with cough.  Treating with Atrovent nasal spray, Prednisone, Tussionex, Tessalon Perles.  Final Clinical Impressions(s) / UC Diagnoses   Final diagnoses:  Viral URI with cough     Discharge Instructions     Medications as prescribed.  Continue use of your albuterol.  Take care  Dr. Adriana Simas   ED Prescriptions    Medication Sig Dispense Auth. Provider   ipratropium (ATROVENT) 0.06 % nasal spray Place 2 sprays into both nostrils 4 (four) times daily as needed for rhinitis. 15 mL Lamonica Trueba G, DO   chlorpheniramine-HYDROcodone (TUSSIONEX PENNKINETIC ER) 10-8  MG/5ML SUER Take 5 mLs by mouth at bedtime as needed. 60 mL Keny Donald G, DO   benzonatate (TESSALON) 100 MG capsule Take 1 capsule (  100 mg total) by mouth 3 (three) times daily as needed. 30 capsule Deserie Dirks G, DO   predniSONE (DELTASONE) 50 MG tablet 1 tablet daily x 5 days 5 tablet Tommie Sams, DO     Controlled Substance Prescriptions [place] Controlled Substance Registry consulted? Not Applicable     Tommie Sams, DO 11/09/18 1303

## 2018-11-12 LAB — CULTURE, GROUP A STREP (THRC)

## 2018-11-19 ENCOUNTER — Other Ambulatory Visit: Payer: Self-pay

## 2018-11-19 ENCOUNTER — Encounter: Payer: Self-pay | Admitting: Emergency Medicine

## 2018-11-19 ENCOUNTER — Ambulatory Visit
Admission: EM | Admit: 2018-11-19 | Discharge: 2018-11-19 | Disposition: A | Discharge status: Home / Self Care | Payer: Medicaid Other | Attending: Internal Medicine | Admitting: Internal Medicine

## 2018-11-19 DIAGNOSIS — J0100 Acute maxillary sinusitis, unspecified: Secondary | ICD-10-CM | POA: Diagnosis not present

## 2018-11-19 DIAGNOSIS — J40 Bronchitis, not specified as acute or chronic: Secondary | ICD-10-CM | POA: Diagnosis not present

## 2018-11-19 MED ORDER — PSEUDOEPHEDRINE HCL ER 120 MG PO TB12: 120.0000 mg | ORAL_TABLET | Freq: Two times a day (BID) | ORAL | 0 refills | Status: DC

## 2018-11-19 MED ORDER — AMOXICILLIN-POT CLAVULANATE 875-125 MG PO TABS: 1.0000 | ORAL_TABLET | Freq: Two times a day (BID) | ORAL | 0 refills | Status: DC

## 2018-11-19 NOTE — Discharge Instructions (Signed)
Do saline rinses twice a day for 5-7 days.  Come back to see [place] if you get worse in 49-72h.

## 2018-11-19 NOTE — ED Provider Notes (Signed)
[facility]    CSN: [id number] Arrival date & time: 11/19/18  9629     History   Chief Complaint Chief Complaint  Patient presents with  . Cough    HPI Susan Swanson is a 23 y.o. female.   Pt is here due to still having cough and wheezing. Has been sick for 2 weeks and was seen here on 11/09/18 and was prescribed cough med, inhaler and prednisone for 5 days. The cough is productive with green and yellow mucous. Has developed severe ST yesterday on both sides. Her nose has been very stuffy and has purulent mucous in the back of her throat. Denies any fevers this week. She has been staying home since her job is closed. Has been using her nose spray, but is not helping.      Past Medical History:  Diagnosis Date  . Anxiety   . Asthma   . Syncope     Patient Active Problem List   Diagnosis Date Noted  . Morbid obesity with BMI of 50.0-59.9, adult (HCC) 08/12/2018  . History of herpes genitalis 12/25/2017  . History of asthma 06/16/2017  . Hx of self-harm 06/16/2017    Past Surgical History:  Procedure Laterality Date  . CESAREAN SECTION      OB History   No obstetric history on file.      Home Medications    Prior to Admission medications   Medication Sig Start Date End Date Taking? Authorizing Provider  albuterol (PROVENTIL HFA;VENTOLIN HFA) 108 (90 Base) MCG/ACT inhaler Inhale 2 puffs into the lungs every 4 (four) hours as needed. 07/02/18  Yes Renford Dills, NP  budesonide-formoterol [facility]) 80-4.5 MCG/ACT inhaler Inhale 2 puffs into the lungs 2 (two) times daily. 08/09/18  Yes Cook, Jayce G, DO  norethindrone (JENCYCLA) 0.35 MG tablet Take 1 tablet by mouth daily.   Yes [provider]  benzonatate (TESSALON) 100 MG capsule Take 1 capsule (100 mg total) by mouth 3 (three) times daily as needed. 11/09/18   Tommie Sams, DO  chlorpheniramine-HYDROcodone (TUSSIONEX PENNKINETIC ER) 10-8 MG/5ML SUER Take 5 mLs by mouth at bedtime as  needed. 11/09/18   Everlene Other G, DO  ipratropium (ATROVENT) 0.06 % nasal spray Place 2 sprays into both nostrils 4 (four) times daily as needed for rhinitis. 11/09/18   Tommie Sams, DO  predniSONE (DELTASONE) 50 MG tablet 1 tablet daily x 5 days 11/09/18   Tommie Sams, DO    Family History Family History  Problem Relation Age of Onset  . Osteoarthritis Mother   . Depression Mother   . Healthy Father     Social History Social History   Tobacco Use  . Smoking status: Former Smoker    Packs/day: 0.50    Types: Cigarettes  . Smokeless tobacco: Never Used  Substance Use Topics  . Alcohol use: Yes    Comment: occasionally  . Drug use: Not Currently    Comment: pt states she has a hx of drug addiciton; pt states she used any and everything.      Allergies   Patient has no known allergies.   Review of Systems Review of Systems  Constitutional: Positive for fatigue. Negative for chills, diaphoresis and fever.  HENT: Positive for congestion, postnasal drip and sore throat. Negative for drooling, ear discharge, ear pain, rhinorrhea, trouble swallowing and voice change.   Eyes: Negative for discharge.  Respiratory: Positive for cough, shortness of breath and wheezing. Negative for chest tightness.  Cardiovascular: Negative for chest pain.  Gastrointestinal: Negative for diarrhea, nausea and vomiting.  Musculoskeletal: Negative for gait problem and myalgias.  Skin: Negative for rash.  Allergic/Immunologic: Negative for environmental allergies.  Neurological: Negative for headaches.  Hematological: Negative for adenopathy.   Physical Exam Triage Vital Signs ED Triage Vitals  Enc Vitals Group     BP 11/19/18 0829 119/79     Pulse Rate 11/19/18 0829 (!) 106     Resp 11/19/18 0829 20     Temp 11/19/18 0829 97.8 F (36.6 C)     Temp Source 11/19/18 0829 Oral     SpO2 11/19/18 0829 99 %     Weight 11/19/18 0825 300 lb (136.1 kg)     Height 11/19/18 0825  (1.651 m)      Head Circumference --      Peak Flow --      Pain Score 11/19/18 0825 9     Pain Loc --      Pain Edu? --      Excl. in GC? --    No data found.  Updated Vital Signs BP 119/79 (BP Location: Left Arm)   Pulse (!) 106   Temp 97.8 F (36.6 C) (Oral)   Resp 20   Ht  (1.651 m)   Wt 300 lb (136.1 kg)   LMP 11/02/2018 (Approximate)   SpO2 99%   BMI 49.92 kg/m   Visual Acuity Right Eye Distance:   Left Eye Distance:   Bilateral Distance:    Right Eye Near:   Left Eye Near:    Bilateral Near:     Physical Exam Vitals signs and nursing note reviewed.  Constitutional:      General: She is not in acute distress.    Appearance: She is not ill-appearing, toxic-appearing or diaphoretic.  HENT:     Head: Normocephalic.     Right Ear: Tympanic membrane, ear canal and external ear normal.     Left Ear: Tympanic membrane, ear canal and external ear normal.     Nose: Congestion and rhinorrhea present.     Mouth/Throat:     Mouth: Mucous membranes are moist.     Pharynx: Posterior oropharyngeal erythema present.     Comments: Has mild erythema with light yellow mucous on the pharynx Eyes:     General: No scleral icterus.    Conjunctiva/sclera: Conjunctivae normal.  Neck:     Musculoskeletal: Neck supple.  Cardiovascular:     Rate and Rhythm: Normal rate and regular rhythm.     Heart sounds: No murmur.  Pulmonary:     Effort: Pulmonary effort is normal.     Breath sounds: Normal breath sounds. No wheezing.     Comments: Has a wheezy sounding cough, but lungs are clear Musculoskeletal: Normal range of motion.  Lymphadenopathy:     Cervical: Cervical adenopathy present.  Skin:    General: Skin is warm and dry.     Coloration: Skin is not jaundiced.     Findings: No rash.  Neurological:     Mental Status: She is alert and oriented to person, place, and time.     Motor: No weakness.     Gait: Gait normal.  Psychiatric:        Mood and Affect: Mood normal.         Behavior: Behavior normal.        Thought Content: Thought content normal.        Judgment: Judgment normal.  UC Treatments / Results  Labs (all labs ordered are listed, but only abnormal results are displayed) Labs Reviewed - No data to display  EKG None  Radiology No results found.  Procedures Procedures  Medications Ordered in UC Medications - No data to display  Initial Impression / Assessment and Plan / UC Course  I have reviewed the triage vital signs and the nursing notes. I believe she has developed a secondary infection: sinusitis which I believe the purulent post nasal drainage is the cause of her ST. I encouraged her to do saline nose rinses bid which she has not been doing. I placed her on sudafed and Augmentin. Needs to continue using her inhaler q 4-6h. If she gets worse she needs to be seen.   Final Clinical Impressions(s) / UC Diagnoses   Final diagnoses:  None   Discharge Instructions   None    ED Prescriptions    None     Controlled Substance Prescriptions [place] Controlled Substance Registry consulted?    Garey Ham, PA-C 11/19/18 1001

## 2018-11-19 NOTE — ED Triage Notes (Signed)
Pt c/o sore throat, bilateral ear pain, cough, shortness of breath, and body aches. She was seen on 11/09/18 for this and treated with prednisone, cough syrup, and nasal spray. She is not feeling any better, she states she even feels worse than before. Denies fever.

## 2019-03-16 ENCOUNTER — Other Ambulatory Visit: Payer: Self-pay

## 2019-03-16 DIAGNOSIS — Z20822 Contact with and (suspected) exposure to covid-19: Secondary | ICD-10-CM

## 2019-03-20 LAB — NOVEL CORONAVIRUS, NAA: SARS-CoV-2, NAA: NOT DETECTED

## 2019-05-23 ENCOUNTER — Ambulatory Visit
Admission: EM | Admit: 2019-05-23 | Discharge: 2019-05-23 | Disposition: A | Discharge status: Home / Self Care | Payer: Medicaid Other | Attending: Family Medicine | Admitting: Family Medicine

## 2019-05-23 ENCOUNTER — Other Ambulatory Visit: Payer: Self-pay

## 2019-05-23 DIAGNOSIS — R51 Headache: Secondary | ICD-10-CM

## 2019-05-23 DIAGNOSIS — B9789 Other viral agents as the cause of diseases classified elsewhere: Secondary | ICD-10-CM | POA: Diagnosis not present

## 2019-05-23 DIAGNOSIS — Z7189 Other specified counseling: Secondary | ICD-10-CM

## 2019-05-23 DIAGNOSIS — J069 Acute upper respiratory infection, unspecified: Secondary | ICD-10-CM | POA: Diagnosis not present

## 2019-05-23 DIAGNOSIS — Z87891 Personal history of nicotine dependence: Secondary | ICD-10-CM | POA: Diagnosis not present

## 2019-05-23 DIAGNOSIS — R0981 Nasal congestion: Secondary | ICD-10-CM | POA: Insufficient documentation

## 2019-05-23 DIAGNOSIS — Z20828 Contact with and (suspected) exposure to other viral communicable diseases: Secondary | ICD-10-CM | POA: Insufficient documentation

## 2019-05-23 DIAGNOSIS — R05 Cough: Secondary | ICD-10-CM | POA: Insufficient documentation

## 2019-05-23 LAB — PREGNANCY, URINE: Preg Test, Ur: NEGATIVE

## 2019-05-23 MED ORDER — HYDROCOD POLST-CPM POLST ER 10-8 MG/5ML PO SUER: 5.0000 mL | Freq: Every evening | ORAL | 0 refills | Status: DC | PRN

## 2019-05-23 MED ORDER — KETOROLAC TROMETHAMINE 60 MG/2ML IM SOLN
60.0000 mg | Freq: Once | INTRAMUSCULAR | Status: AC
  Administered 2019-05-23: 11:00:00 60 mg via INTRAMUSCULAR

## 2019-05-23 NOTE — Discharge Instructions (Addendum)
Take medication as prescribed. Rest. Drink plenty of fluids.  Continue over-the-counter Mucinex.Follow covid-19 instructions.   Follow up with your primary care physician this week as needed. Return to Urgent care for new or worsening concerns.

## 2019-05-23 NOTE — ED Provider Notes (Addendum)
[facility] ____________________________________________  Time seen: Approximately 11:12 AM  I have reviewed the triage vital signs and the nursing notes.   HISTORY  Chief Complaint Cough, Nasal Congestion, and Headache   HPI Susan Swanson is a 23 y.o. female presenting for evaluation of 2 days of runny nose, cough, congestion complaints.  Patient reports initially she had a sore throat but a sore throat has resolved and not returned.  States cough is intermittently productive of greenish mucus and keeping her up at night.  States he felt fine prior to 2 days ago.  States since yesterday she is having a lot of sinus congestion and headache.  Unresolved with over-the-counter BC powders and Mucinex congestion medications.  Denies known fevers.  Did have some body aches.  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  No home sick contacts.  Denies chest pain or shortness of breath, changes in taste or smell, vomiting or diarrhea.  Continues to drink fluids well but decreased appetite.  Denies recent sickness.    Past Medical History:  Diagnosis Date  . Anxiety   . Asthma   . Syncope     Patient Active Problem List   Diagnosis Date Noted  . Morbid obesity with BMI of 50.0-59.9, adult ([place]) 08/12/2018  . History of herpes genitalis 12/25/2017  . History of asthma 06/16/2017  . Hx of self-harm 06/16/2017    Past Surgical History:  Procedure Laterality Date  . CESAREAN SECTION       No current facility-administered medications for this encounter.   Current Outpatient Medications:  .  albuterol (PROVENTIL HFA;VENTOLIN HFA) 108 (90 Base) MCG/ACT inhaler, Inhale 2 puffs into the lungs every 4 (four) hours as needed., Disp: 1 Inhaler, Rfl: 0 .  budesonide-formoterol (SYMBICORT) 80-4.5 MCG/ACT inhaler, Inhale 2 puffs into the lungs 2 (two) times daily., Disp: 1 Inhaler, Rfl: 12 .  chlorpheniramine-HYDROcodone (TUSSIONEX PENNKINETIC ER) 10-8 MG/5ML SUER, Take 5 mLs  by mouth at bedtime as needed. do not drive or operate machinery while taking as can cause drowsiness., Disp: 40 mL, Rfl: 0  Allergies Patient has no known allergies.  Family History  Problem Relation Age of Onset  . Osteoarthritis Mother   . Depression Mother   . Healthy Father     Social History Social History   Tobacco Use  . Smoking status: Former Smoker    Packs/day: 0.50    Types: Cigarettes  . Smokeless tobacco: Never Used  Substance Use Topics  . Alcohol use: Yes    Comment: occasionally  . Drug use: Not Currently    Comment: pt states she has a hx of drug addiciton; pt states she used any and everything.     Review of Systems Constitutional: No fever Eyes: No visual changes. ENT: As above.  Cardiovascular: Denies chest pain. Respiratory: Denies shortness of breath. Gastrointestinal: No abdominal pain.  No nausea, no vomiting.  No diarrhea.   Genitourinary: Negative for dysuria. Musculoskeletal: Negative for back pain. Skin: Negative for rash. Neurological: Positive headache.  Negative for focal weakness or numbness.   ____________________________________________   PHYSICAL EXAM:  VITAL SIGNS: ED Triage Vitals  Enc Vitals Group     BP 05/23/19 1046 107/76     Pulse Rate 05/23/19 1046 (!) 108     Resp 05/23/19 1046 16     Temp 05/23/19 1046 98.1 F (36.7 C)     Temp Source 05/23/19 1046 Oral     SpO2 05/23/19 1046 100 %  Weight 05/23/19 1047 300 lb (136.1 kg)     Height 05/23/19 1047 5\' 5"  (1.651 m)     Head Circumference --      Peak Flow --      Pain Score 05/23/19 1047 9     Pain Loc --      Pain Edu? --      Excl. in GC? --     Constitutional: Alert and oriented.  Eyes: Conjunctivae are normal. PERRL. EOMI. Head: Atraumatic. No swelling. No erythema.  Ears: no erythema, normal TMs bilaterally.   Nose:Nasal congestion with clear rhinorrhea  Neck: No stridor.  No cervical spine tenderness to palpation.  Hematological/Lymphatic/Immunilogical: No cervical lymphadenopathy. Cardiovascular: Normal rate, regular rhythm. Grossly normal heart sounds.  Good peripheral circulation. Respiratory: Normal respiratory effort.  No retractions. No wheezes, rales or rhonchi. Good air movement.  Occasional dry cough noted in room. Musculoskeletal: Ambulatory with steady gait. No extremity edema noted.  Neurologic:  Normal speech and language. No gait instability. Skin:  Skin appears warm, dry and intact. No rash noted. Psychiatric: Mood and affect are normal. Speech and behavior are normal. ___________________________________________   LABS (all labs ordered are listed, but only abnormal results are displayed)  Labs Reviewed  NOVEL CORONAVIRUS, NAA (HOSP ORDER, SEND-OUT TO REF LAB; TAT 18-24 HRS)  PREGNANCY, URINE     PROCEDURES Procedures   INITIAL IMPRESSION / ASSESSMENT AND PLAN / ED COURSE  Pertinent labs & imaging results that were available during my care of the patient were reviewed by me and considered in my medical decision making (see chart for details).  Presenting for evaluation of cough, congestion and headache complaints for the last 2 days.  No known fevers.  Suspect viral illness.  COVID-19 testing completed and education given.  Urine pregnancy negative.  60 mg IM Toradol given once in urgent care.  Will Rx PRN Tussionex at night, continue over-the-counter Mucinex and decongestants during the day, rest, fluids.  Discussed her follow-up and return parameters.Discussed indication, risks and benefits of medications with patient.  Discussed follow up with Primary care physician this week as needed. Discussed follow up and return parameters including no resolution or any worsening concerns. Patient verbalized understanding and agreed to plan.   ____________________________________________   FINAL CLINICAL IMPRESSION(S) / ED DIAGNOSES  Final diagnoses:  Viral URI with cough  Advice  Given About Covid-19 Virus Infection     ED Discharge Orders         Ordered    chlorpheniramine-HYDROcodone (TUSSIONEX PENNKINETIC ER) 10-8 MG/5ML SUER  At bedtime PRN     05/23/19 1138           Note: This dictation was prepared with Dragon dictation along with smaller phrase technology. Any transcriptional errors that result from this process are unintentional.           05/25/19, NP 05/23/19 1149

## 2019-05-23 NOTE — ED Triage Notes (Signed)
Reports cough, nasal congestion that started last night. Headache X 2 days, that is no better with OTC medications. Pt alert and oriented X4, cooperative, RR even and unlabored, color WNL. Pt in NAD.

## 2019-05-24 LAB — NOVEL CORONAVIRUS, NAA (HOSP ORDER, SEND-OUT TO REF LAB; TAT 18-24 HRS): SARS-CoV-2, NAA: NOT DETECTED

## 2019-05-25 ENCOUNTER — Encounter (HOSPITAL_COMMUNITY): Payer: Self-pay

## 2019-07-30 ENCOUNTER — Ambulatory Visit
Admission: EM | Admit: 2019-07-30 | Discharge: 2019-07-30 | Disposition: A | Discharge status: Home / Self Care | Payer: Medicaid Other | Attending: Emergency Medicine | Admitting: Emergency Medicine

## 2019-07-30 ENCOUNTER — Other Ambulatory Visit: Payer: Self-pay

## 2019-07-30 ENCOUNTER — Encounter: Payer: Self-pay | Admitting: Gynecology

## 2019-07-30 DIAGNOSIS — R197 Diarrhea, unspecified: Secondary | ICD-10-CM | POA: Insufficient documentation

## 2019-07-30 DIAGNOSIS — R1110 Vomiting, unspecified: Secondary | ICD-10-CM | POA: Diagnosis present

## 2019-07-30 DIAGNOSIS — Z20828 Contact with and (suspected) exposure to other viral communicable diseases: Secondary | ICD-10-CM | POA: Diagnosis present

## 2019-07-30 DIAGNOSIS — Z20822 Contact with and (suspected) exposure to covid-19: Secondary | ICD-10-CM

## 2019-07-30 MED ORDER — ONDANSETRON 8 MG PO TBDP: ORAL_TABLET | ORAL | 0 refills | Status: DC

## 2019-07-30 MED ORDER — AEROCHAMBER PLUS MISC: 2 refills | Status: DC

## 2019-07-30 MED ORDER — ALBUTEROL SULFATE HFA 108 (90 BASE) MCG/ACT IN AERS: 1.0000 | INHALATION_SPRAY | Freq: Four times a day (QID) | RESPIRATORY_TRACT | 0 refills | Status: DC | PRN

## 2019-07-30 MED ORDER — IBUPROFEN 600 MG PO TABS: 600.0000 mg | ORAL_TABLET | Freq: Four times a day (QID) | ORAL | 0 refills | Status: DC | PRN

## 2019-07-30 NOTE — ED Triage Notes (Signed)
Patient c/o loss of taste/ diarrhea and hurts to take a deep breathe x 4 days.

## 2019-07-30 NOTE — ED Provider Notes (Signed)
HPI  SUBJECTIVE:  Susan Swanson is a 23 y.o. female who presents with 3 days of nausea, multiple episodes of nonbloody nonbilious emesis, watery, nonbloody diarrhea.  She states that she has vomited once today and that the diarrhea seems to be slowing down.  States that she is tolerating p.o. today.  She reports loss of taste and smell, body aches, headaches, fatigue, mild cough.  She reports pain substernal pain with inspiration.  No fevers, nasal congestion, sore throat, shortness of breath, wheezing, abdominal pain.  Slightly decreased urine output.  No antipyretic in the past 4 to 6 hours.  No exposure to Covid, no sick contacts with diarrheal illnesses.  No raw or undercooked foods, questionable leftovers, recent travel.  She has tried pushing fluids without improvement in her symptoms.  There are no other aggravating or alleviating factors.  She has a past medical history of asthma, continues to smoke, syncope.  No history of chronic kidney disease, diabetes, HIV, cancer, immunocompromise, coronary artery disease.  LMP: 5 years ago.  Denies possibility of being pregnant.  PMD: None.    Past Medical History:  Diagnosis Date  . Anxiety   . Asthma   . Syncope     Past Surgical History:  Procedure Laterality Date  . CESAREAN SECTION      Family History  Problem Relation Age of Onset  . Osteoarthritis Mother   . Depression Mother   . Healthy Father     Social History   Tobacco Use  . Smoking status: Former Smoker    Packs/day: 0.50    Types: Cigarettes  . Smokeless tobacco: Never Used  Substance Use Topics  . Alcohol use: Yes    Comment: occasionally  . Drug use: Not Currently    Comment: pt states she has a hx of drug addiciton; pt states she used any and everything.     No current facility-administered medications for this encounter.   Current Outpatient Medications:  .  budesonide-formoterol (SYMBICORT) 80-4.5 MCG/ACT inhaler, Inhale 2 puffs into the lungs 2 (two)  times daily., Disp: 1 Inhaler, Rfl: 12 .  albuterol (VENTOLIN HFA) 108 (90 Base) MCG/ACT inhaler, Inhale 1-2 puffs into the lungs every 6 (six) hours as needed for wheezing or shortness of breath., Disp: 18 g, Rfl: 0 .  ibuprofen (ADVIL) 600 MG tablet, Take 1 tablet (600 mg total) by mouth every 6 (six) hours as needed., Disp: 30 tablet, Rfl: 0 .  ondansetron (ZOFRAN ODT) 8 MG disintegrating tablet, 1/2- 1 tablet q 8 hr prn nausea, vomiting, Disp: 20 tablet, Rfl: 0 .  Spacer/Aero-Holding Chambers (AEROCHAMBER PLUS) inhaler, Use as instructed, Disp: 1 each, Rfl: 2  No Known Allergies   ROS  As noted in HPI.   Physical Exam  BP 116/71 (BP Location: Left Arm)   Pulse 90   Temp 98 F (36.7 C) (Tympanic)   Resp 18   Ht 5\' 5"  (1.651 m)   Wt 136.1 kg   LMP  (LMP Unknown) Comment: just came off birth control x 69months  SpO2 98%   BMI 49.92 kg/m   Constitutional: Well developed, well nourished, no acute distress.  Appears ill Eyes:  EOMI, conjunctiva normal bilaterally HENT: Normocephalic, atraumatic,mucus membranes moist Neck: No cervical lymphadenopathy, meningismus Respiratory: Normal inspiratory effort, lungs clear bilaterally Cardiovascular: Normal rate, regular rhythm no murmurs rubs or gallops.  Cap refill approximately 2 seconds.   reproducible chest wall tenderness along the sternal costochondral junctions. GI: nondistended skin: No rash, skin intact  Musculoskeletal: no deformities Neurologic: Alert & oriented x 3, no focal neuro deficits Psychiatric: Speech and behavior appropriate   ED Course   Medications - No data to display  Orders Placed This Encounter  Procedures  . Novel Coronavirus, NAA (Hosp order, Send-out to Ref Lab; TAT 18-24 hrs    Standing Status:   Standing    Number of Occurrences:   1    Order Specific Question:   Is this test for diagnosis or screening    Answer:   Screening    Order Specific Question:   Symptomatic for COVID-19 as defined by CDC     Answer:   Yes    Order Specific Question:   Date of Symptom Onset    Answer:   07/27/2019    Order Specific Question:   Hospitalized for COVID-19    Answer:   Yes    Order Specific Question:   Admitted to ICU for COVID-19    Answer:   No    Order Specific Question:   Previously tested for COVID-19    Answer:   Yes    Order Specific Question:   Resident in a congregate (group) care setting    Answer:   No    Order Specific Question:   Employed in healthcare setting    Answer:   No    Order Specific Question:   Pregnant    Answer:   No    No results found for this or any previous visit (from the past 24 hour(s)). No results found.  ED Clinical Impression  1. Suspected COVID-19 virus infection   2. Encounter for laboratory testing for COVID-19 virus   3. Vomiting and diarrhea      ED Assessment/Plan  Suspect COVID-19 infection.  Covid PCR sent.  I believe she is a little dehydrated, but can attempt oral rehydration at home.  Sending home with albuterol inhaler with a spacer because of a history of asthma, Zofran 8 mg, ibuprofen 600 mg combined with 1 g of Tylenol, push electrolyte containing fluids until her urine is clear.  Will provide primary care list for ongoing care.  To the ER if she gets worse.  Discussed labs, MDM, treatment plan, and plan for follow-up with patient. Discussed sn/sx that should prompt return to the ED. patient agrees with plan.   Meds ordered this encounter  Medications  . albuterol (VENTOLIN HFA) 108 (90 Base) MCG/ACT inhaler    Sig: Inhale 1-2 puffs into the lungs every 6 (six) hours as needed for wheezing or shortness of breath.    Dispense:  18 g    Refill:  0  . ibuprofen (ADVIL) 600 MG tablet    Sig: Take 1 tablet (600 mg total) by mouth every 6 (six) hours as needed.    Dispense:  30 tablet    Refill:  0  . Spacer/Aero-Holding Chambers (AEROCHAMBER PLUS) inhaler    Sig: Use as instructed    Dispense:  1 each    Refill:  2  . ondansetron  (ZOFRAN ODT) 8 MG disintegrating tablet    Sig: 1/2- 1 tablet q 8 hr prn nausea, vomiting    Dispense:  20 tablet    Refill:  0    *This clinic note was created using [employer]. Therefore, there may be occasional mistakes despite careful proofreading.      Melynda Ripple, MD 07/31/19 1450

## 2019-07-30 NOTE — Discharge Instructions (Addendum)
Push electrolyte containing fluids such as Pedialyte and Gatorade until your urine is clear.  The Zofran should help with vomiting and diarrhea.  600 mg of ibuprofen combined with 1 g of Tylenol 3-4 times a day as needed for body aches, headaches.  2 puffs from your albuterol inhaler using your spacer every 4 hours as needed for coughing, wheezing, shortness of breath.  Your Covid test will take 24 to 48 hours to come back.  If you are still having symptoms and your Covid test is negative, I would wait another 48 to 72 hours and get another Covid test, or wait quarantine  yourself for 10 days. Follow-up with a primary care physician, see list below.  Here is a list of primary care providers who are taking new patients:  Dr. Otilio Miu, Dr. Adline Potter [address] [place] [address] [place] [address]  [phone]  [facility] [address] [place], [place] [address] [phone]  [facility]  [phone] [place], [place] [address]  Here are clinics/ other resources who will see you if you do not have insurance. Some have certain criteria that you must meet. Call them and find out what they are:  [facility]: [address] [place], [place] [address] Phone: [phone] Hours: First and Third Saturdays of each Month, 9 a.m. - 1 p.m.  [facility]: [address] [place], [place], [place] [address] Phone: [phone] Hours: Tuesday, 4 p.m. - 8 p.m. Thursday, 1 p.m. - 8 p.m. Wednesday, 9 a.m. - [facility] [address] [place], [place] [address] Phone: [phone] Pharmacy Phone Number: [phone] Dental Phone Number: [phone] [facility]: [phone]  Dental Hours: Monday - Thursday, 8 a.m. - 6 p.m.  [facility] [address] [place], [place] [address] Phone:  [phone] Pharmacy Phone Number: [phone] [facility] Insurance Help: [phone]  [facility] [address] [place], [place] [address] Phone: [phone] Pharmacy Phone Number: [phone] [facility] Insurance Help: [phone]  [facility] [address] [place], [place] [address] Phone: [phone] [facility] Insurance Help: [phone]   [address] [place], [place] [address] Phone: [phone]  Go to www.goodrx.com to look up your medications. This will give you a list of where you can find your prescriptions at the most affordable prices. Or ask the pharmacist what the cash price is, or if they have any other discount programs available to help make your medication more affordable. This can be less expensive than what you would pay with insurance.

## 2019-07-31 LAB — NOVEL CORONAVIRUS, NAA (HOSP ORDER, SEND-OUT TO REF LAB; TAT 18-24 HRS): SARS-CoV-2, NAA: NOT DETECTED

## 2019-08-27 ENCOUNTER — Ambulatory Visit
Admission: EM | Admit: 2019-08-27 | Discharge: 2019-08-27 | Disposition: A | Discharge status: Home / Self Care | Payer: Medicaid Other | Attending: Internal Medicine | Admitting: Internal Medicine

## 2019-08-27 ENCOUNTER — Ambulatory Visit: Payer: Medicaid Other

## 2019-08-27 ENCOUNTER — Encounter: Payer: Self-pay | Admitting: Emergency Medicine

## 2019-08-27 ENCOUNTER — Other Ambulatory Visit: Payer: Self-pay

## 2019-08-27 DIAGNOSIS — Z915 Personal history of self-harm: Secondary | ICD-10-CM | POA: Diagnosis not present

## 2019-08-27 DIAGNOSIS — Z7951 Long term (current) use of inhaled steroids: Secondary | ICD-10-CM | POA: Insufficient documentation

## 2019-08-27 DIAGNOSIS — A084 Viral intestinal infection, unspecified: Secondary | ICD-10-CM

## 2019-08-27 DIAGNOSIS — Z6841 Body Mass Index (BMI) 40.0 and over, adult: Secondary | ICD-10-CM | POA: Insufficient documentation

## 2019-08-27 DIAGNOSIS — Z87891 Personal history of nicotine dependence: Secondary | ICD-10-CM | POA: Insufficient documentation

## 2019-08-27 DIAGNOSIS — J45909 Unspecified asthma, uncomplicated: Secondary | ICD-10-CM | POA: Insufficient documentation

## 2019-08-27 DIAGNOSIS — Z20828 Contact with and (suspected) exposure to other viral communicable diseases: Secondary | ICD-10-CM | POA: Insufficient documentation

## 2019-08-27 DIAGNOSIS — Z79899 Other long term (current) drug therapy: Secondary | ICD-10-CM | POA: Insufficient documentation

## 2019-08-27 LAB — BASIC METABOLIC PANEL
Anion gap: 8 (ref 5–15)
BUN: 9 mg/dL (ref 6–20)
CO2: 22 mmol/L (ref 22–32)
Calcium: 8.9 mg/dL (ref 8.9–10.3)
Chloride: 104 mmol/L (ref 98–111)
Creatinine, Ser: 0.76 mg/dL (ref 0.44–1.00)
GFR calc Af Amer: >60 mL/min (ref >60)
GFR calc non Af Amer: >60 mL/min (ref >60)
Glucose, Bld: 110 mg/dL — ABNORMAL HIGH (ref 70–99)
Potassium: 3.6 mmol/L (ref 3.5–5.1)
Sodium: 134 mmol/L — ABNORMAL LOW (ref 135–145)

## 2019-08-27 LAB — CBC
HCT: 45.4 % (ref 36.0–46.0)
Hemoglobin: 15.2 g/dL — ABNORMAL HIGH (ref 12.0–15.0)
MCH: 27.2 pg (ref 26.0–34.0)
MCHC: 33.5 g/dL (ref 30.0–36.0)
MCV: 81.2 fL (ref 80.0–100.0)
Platelets: 228 10*3/uL (ref 150–400)
RBC: 5.59 MIL/uL — ABNORMAL HIGH (ref 3.87–5.11)
RDW: 12.8 % (ref 11.5–15.5)
WBC: 7.9 10*3/uL (ref 4.0–10.5)
nRBC: 0 % (ref 0.0–0.2)

## 2019-08-27 LAB — SARS CORONAVIRUS 2 AG (30 MIN TAT): SARS Coronavirus 2 Ag: NEGATIVE

## 2019-08-27 MED ORDER — ONDANSETRON 4 MG PO TBDP: 4.0000 mg | ORAL_TABLET | Freq: Three times a day (TID) | ORAL | 0 refills | Status: DC | PRN

## 2019-08-27 MED ORDER — ACETAMINOPHEN 500 MG PO TABS: 500.0000 mg | ORAL_TABLET | Freq: Four times a day (QID) | ORAL | 0 refills | Status: DC | PRN

## 2019-08-27 NOTE — ED Provider Notes (Signed)
[facility]    CSN: [id number] Arrival date & time: 08/27/19  [id number]      History   Chief Complaint Chief Complaint  Patient presents with  . Emesis    HPI Susan Swanson is a 23 y.o. female with history of asthma comes to urgent care with complaints of nausea, vomiting and diarrhea over the past 24 hours.  Patient admits to having some subjective fever and generalized body aches.  Patient started having nasal congestion about a week ago is associated with a cough which is productive of sputum.  No shortness of breath or wheezing.  She denies any sick contacts.  Patient has had several episodes of emesis over the past 24 hours.  She has had several episodes of diarrhea as well.  She has not tried any over-the-counter remedies.  She denies abdominal pain or distention.  She has had some dizziness and lightheadedness.  Vomitus is nonbilious/nonbloody.  Stools are without blood. HPI  Past Medical History:  Diagnosis Date  . Anxiety   . Asthma   . Syncope     Patient Active Problem List   Diagnosis Date Noted  . Morbid obesity with BMI of 50.0-59.9, adult (HCC) 08/12/2018  . History of herpes genitalis 12/25/2017  . History of asthma 06/16/2017  . Hx of self-harm 06/16/2017    Past Surgical History:  Procedure Laterality Date  . CESAREAN SECTION      OB History   No obstetric history on file.      Home Medications    Prior to Admission medications   Medication Sig Start Date End Date Taking? Authorizing Provider  albuterol (VENTOLIN HFA) 108 (90 Base) MCG/ACT inhaler Inhale 1-2 puffs into the lungs every 6 (six) hours as needed for wheezing or shortness of breath. 07/30/19  Yes Domenick GongMortenson, Ashley, MD  budesonide-formoterol [facility](SYMBICORT) 80-4.5 MCG/ACT inhaler Inhale 2 puffs into the lungs 2 (two) times daily. 08/09/18  Yes Cook, Jayce G, DO  ibuprofen (ADVIL) 600 MG tablet Take 1 tablet (600 mg total) by mouth every 6 (six) hours as needed. 07/30/19  Yes  Domenick GongMortenson, Ashley, MD  acetaminophen (TYLENOL) 500 MG tablet Take 1 tablet (500 mg total) by mouth every 6 (six) hours as needed. 08/27/19   Merrilee JanskyLamptey, Vinette Crites O, MD  ondansetron (ZOFRAN ODT) 4 MG disintegrating tablet Take 1 tablet (4 mg total) by mouth every 8 (eight) hours as needed for nausea or vomiting. 08/27/19   Merrilee JanskyLamptey, Shyra Emile O, MD  Spacer/Aero-Holding Chambers (AEROCHAMBER PLUS) inhaler Use as instructed 07/30/19   Domenick GongMortenson, Ashley, MD  norethindrone (JENCYCLA) 0.35 MG tablet Take 1 tablet by mouth daily.  05/23/19  [provider]    Family History Family History  Problem Relation Age of Onset  . Osteoarthritis Mother   . Depression Mother   . Healthy Father     Social History Social History   Tobacco Use  . Smoking status: Former Smoker    Packs/day: 0.50    Types: Cigarettes  . Smokeless tobacco: Never Used  Substance Use Topics  . Alcohol use: Yes    Comment: occasionally  . Drug use: Not Currently    Comment: pt states she has a hx of drug addiciton; pt states she used any and everything.      Allergies   Patient has no known allergies.   Review of Systems Review of Systems  Constitutional: Positive for activity change, fatigue and fever. Negative for chills, diaphoresis and unexpected weight change.  HENT: Positive for  congestion. Negative for ear discharge, ear pain, hearing loss, mouth sores and sore throat.   Eyes: Negative for pain, redness and itching.  Respiratory: Positive for cough. Negative for chest tightness, shortness of breath and wheezing.   Cardiovascular: Negative for chest pain and palpitations.  Gastrointestinal: Positive for diarrhea, nausea and vomiting. Negative for abdominal pain and rectal pain.  Endocrine: Negative for cold intolerance, polydipsia, polyphagia and polyuria.  Genitourinary: Negative for dysuria, flank pain, frequency and urgency.  Musculoskeletal: Positive for arthralgias and myalgias.  Skin: Negative for rash  and wound.  Neurological: Positive for dizziness, light-headedness and headaches. Negative for syncope and facial asymmetry.  Hematological: Negative.   Psychiatric/Behavioral: Negative for confusion and decreased concentration.     Physical Exam Triage Vital Signs ED Triage Vitals  Enc Vitals Group     BP 08/27/19 0830 121/81     Pulse Rate 08/27/19 0830 90     Resp 08/27/19 0830 18     Temp 08/27/19 0830 97.8 F (36.6 C)     Temp Source 08/27/19 0830 Oral     SpO2 08/27/19 0830 99 %     Weight 08/27/19 0826 300 lb (136.1 kg)     Height 08/27/19 0826 5\' 5"  (1.651 m)     Head Circumference --      Peak Flow --      Pain Score 08/27/19 0825 9     Pain Loc --      Pain Edu? --      Excl. in [place]? --    No data found.  Updated Vital Signs BP 121/81 (BP Location: Left Arm)   Pulse 90   Temp 97.8 F (36.6 C) (Oral)   Resp 18   Ht 5\' 5"  (1.651 m)   Wt 136.1 kg   LMP  (LMP Unknown) Comment: just came off birth control x 50months  SpO2 99%   BMI 49.92 kg/m   Visual Acuity Right Eye Distance:   Left Eye Distance:   Bilateral Distance:    Right Eye Near:   Left Eye Near:    Bilateral Near:     Physical Exam Vitals and nursing note reviewed.  Constitutional:      General: She is not in acute distress.    Appearance: She is ill-appearing. She is not toxic-appearing.  HENT:     Right Ear: Tympanic membrane normal.     Left Ear: Tympanic membrane normal.     Nose: Nose normal. No rhinorrhea.     Mouth/Throat:     Mouth: Mucous membranes are moist.     Pharynx: No posterior oropharyngeal erythema.  Eyes:     General:        Right eye: No discharge.        Left eye: No discharge.     Extraocular Movements: Extraocular movements intact.  Cardiovascular:     Rate and Rhythm: Normal rate and regular rhythm.     Pulses: Normal pulses.     Heart sounds: Normal heart sounds. No murmur. No friction rub.  Pulmonary:     Effort: Pulmonary effort is normal. No respiratory  distress.     Breath sounds: No wheezing, rhonchi or rales.  Abdominal:     General: Bowel sounds are normal.     Palpations: Abdomen is soft.     Tenderness: There is no abdominal tenderness. There is no guarding or rebound.     Hernia: No hernia is present.  Musculoskeletal:  General: No swelling or signs of injury. Normal range of motion.     Cervical back: Normal range of motion. No rigidity.     Right lower leg: No edema.  Lymphadenopathy:     Cervical: No cervical adenopathy.  Skin:    General: Skin is warm.     Capillary Refill: Capillary refill takes less than 2 seconds.     Findings: No erythema or rash.  Neurological:     General: No focal deficit present.     Mental Status: She is oriented to person, place, and time.  Psychiatric:        Mood and Affect: Mood normal.        Behavior: Behavior normal.        Thought Content: Thought content normal.      UC Treatments / Results  Labs (all labs ordered are listed, but only abnormal results are displayed) Labs Reviewed  CBC - Abnormal; Notable for the following components:      Result Value   RBC 5.59 (*)    Hemoglobin 15.2 (*)    All other components within normal limits  SARS CORONAVIRUS 2 AG (30 MIN TAT)  NOVEL CORONAVIRUS, NAA  BASIC METABOLIC PANEL    EKG   Radiology DG Chest 2 View  Result Date: 08/27/2019 CLINICAL DATA:  Cough, nausea, vomiting, diarrhea and body aches. EXAM: CHEST - 2 VIEW COMPARISON:  None. FINDINGS: The heart size and mediastinal contours are within normal limits. There is no evidence of pulmonary edema, consolidation, pneumothorax, nodule or pleural fluid. The visualized skeletal structures are unremarkable. IMPRESSION: No active cardiopulmonary disease. Electronically Signed   By: Irish Lack M.D.   On: 08/27/2019 09:27    Procedures Procedures (including critical care time)  Medications Ordered in UC Medications - No data to display  Initial Impression /  Assessment and Plan / UC Course  I have reviewed the triage vital signs and the nursing notes.  Pertinent labs & imaging results that were available during my care of the patient were reviewed by me and considered in my medical decision making (see chart for details).     1.  Acute viral syndrome: Patient encouraged to optimize oral fluid intake CBC, BMP COVID-19 PCR testing sent Rapid Covid test is negative Zofran as needed for nausea vomiting If patient's symptoms worsen she is advised to return to urgent care to be reevaluated. Tylenol as needed for generalized body aches.  Final Clinical Impressions(s) / UC Diagnoses   Final diagnoses:  Viral gastroenteritis     Discharge Instructions     Increase oral fluid intake. Continue to wear facemask, social distance and respiratory/hand etiquette. COVID-19 PCR test has been sent I encourage you to sign up for MyChart so we can see the results   ED Prescriptions    Medication Sig Dispense Auth. Provider   ondansetron (ZOFRAN ODT) 4 MG disintegrating tablet Take 1 tablet (4 mg total) by mouth every 8 (eight) hours as needed for nausea or vomiting. 20 tablet Evana Runnels, Britta Mccreedy, MD   acetaminophen (TYLENOL) 500 MG tablet Take 1 tablet (500 mg total) by mouth every 6 (six) hours as needed. 30 tablet Ameah Chanda, Britta Mccreedy, MD     PDMP not reviewed this encounter.   Merrilee Jansky, MD 08/27/19 1004

## 2019-08-27 NOTE — Discharge Instructions (Signed)
Increase oral fluid intake. Continue to wear facemask, social distance and respiratory/hand etiquette. COVID-19 PCR test has been sent I encourage you to sign up for MyChart so we can see the results

## 2019-08-27 NOTE — ED Triage Notes (Signed)
Pt c/o nausea, diarrhea, and vomiting. Started last night. She states she also has had body aches and subjective fever.

## 2019-08-28 LAB — NOVEL CORONAVIRUS, NAA (HOSP ORDER, SEND-OUT TO REF LAB; TAT 18-24 HRS): SARS-CoV-2, NAA: NOT DETECTED

## 2019-11-02 ENCOUNTER — Ambulatory Visit: Payer: Medicaid Other

## 2019-11-02 ENCOUNTER — Other Ambulatory Visit: Payer: Self-pay

## 2019-11-02 ENCOUNTER — Ambulatory Visit
Admission: EM | Admit: 2019-11-02 | Discharge: 2019-11-02 | Disposition: A | Discharge status: Home / Self Care | Payer: Medicaid Other | Attending: Family Medicine | Admitting: Family Medicine

## 2019-11-02 DIAGNOSIS — J45901 Unspecified asthma with (acute) exacerbation: Secondary | ICD-10-CM | POA: Insufficient documentation

## 2019-11-02 DIAGNOSIS — J988 Other specified respiratory disorders: Secondary | ICD-10-CM | POA: Diagnosis not present

## 2019-11-02 MED ORDER — PREDNISONE 50 MG PO TABS: ORAL_TABLET | ORAL | 0 refills | Status: DC

## 2019-11-02 MED ORDER — DOXYCYCLINE HYCLATE 100 MG PO CAPS: 100.0000 mg | ORAL_CAPSULE | Freq: Two times a day (BID) | ORAL | 0 refills | Status: DC

## 2019-11-02 NOTE — ED Provider Notes (Signed)
[facility]    CSN: [id number] Arrival date & time: 11/02/19  1230  History   Chief Complaint Chief Complaint  Patient presents with  . Cough   HPI  24 year old female presents with cough, congestion, sinus pain and pressure.  4-day history of symptoms.  Progressively worsening.  Covid test negative.  Patient received your test results today.  She has longstanding history of asthma.  She has been using her inhaler without resolution.  No documented fever.  Feels poorly.  Associated fatigue.  No known Covid exposure.  She has been taking over-the-counter medication as well as her inhalers without resolution.   Past Medical History:  Diagnosis Date  . Anxiety   . Asthma   . Syncope    Patient Active Problem List   Diagnosis Date Noted  . Morbid obesity with BMI of 50.0-59.9, adult (HCC) 08/12/2018  . History of herpes genitalis 12/25/2017  . History of asthma 06/16/2017  . Hx of self-harm 06/16/2017   Past Surgical History:  Procedure Laterality Date  . CESAREAN SECTION     OB History   No obstetric history on file.    Home Medications    Prior to Admission medications   Medication Sig Start Date End Date Taking? Authorizing Provider  albuterol (VENTOLIN HFA) 108 (90 Base) MCG/ACT inhaler Inhale 1-2 puffs into the lungs every 6 (six) hours as needed for wheezing or shortness of breath. 07/30/19   Domenick Gong, MD  budesonide-formoterol (SYMBICORT) 80-4.5 MCG/ACT inhaler Inhale 2 puffs into the lungs 2 (two) times daily. 08/09/18   Tommie Sams, DO  doxycycline (VIBRAMYCIN) 100 MG capsule Take 1 capsule (100 mg total) by mouth 2 (two) times daily. 11/02/19   Tommie Sams, DO  predniSONE (DELTASONE) 50 MG tablet 1 tablet daily x 5 days 11/02/19   Tommie Sams, DO  norethindrone (JENCYCLA) 0.35 MG tablet Take 1 tablet by mouth daily.  05/23/19  [provider]   Family History Family History  Problem Relation Age of Onset  . Osteoarthritis Mother     . Depression Mother   . Healthy Father    Social History Social History   Tobacco Use  . Smoking status: Current Every Day Smoker    Packs/day: 0.50    Types: Cigarettes  . Smokeless tobacco: Never Used  Substance Use Topics  . Alcohol use: Yes    Comment: occasionally  . Drug use: Not Currently    Comment: pt states she has a hx of drug addiciton; pt states she used any and everything.     Allergies   Patient has no known allergies.   Review of Systems Review of Systems  Constitutional: Positive for fatigue. Negative for fever.  HENT: Positive for congestion, sinus pressure and sinus pain.   Respiratory: Positive for cough and wheezing.    Physical Exam Triage Vital Signs ED Triage Vitals  Enc Vitals Group     BP 11/02/19 1257 111/78     Pulse Rate 11/02/19 1257 (!) 109     Resp --      Temp 11/02/19 1257 97.6 F (36.4 C)     Temp Source 11/02/19 1257 Oral     SpO2 11/02/19 1257 95 %     Weight --      Height --      Head Circumference --      Peak Flow --      Pain Score 11/02/19 1248 7     Pain Loc --  Pain Edu? --      Excl. in [place]? --    Updated Vital Signs BP 111/78 (BP Location: Left Arm)   Pulse (!) 109   Temp 97.6 F (36.4 C) (Oral)   SpO2 95%   Visual Acuity Right Eye Distance:   Left Eye Distance:   Bilateral Distance:    Right Eye Near:   Left Eye Near:    Bilateral Near:     Physical Exam Vitals and nursing note reviewed.  Constitutional:      General: She is not in acute distress.    Appearance: She is obese.     Comments: Appears mildly ill.  HENT:     Head: Normocephalic and atraumatic.     Mouth/Throat:     Pharynx: Posterior oropharyngeal erythema present. No oropharyngeal exudate.  Eyes:     General:        Right eye: No discharge.        Left eye: No discharge.     Conjunctiva/sclera: Conjunctivae normal.  Cardiovascular:     Rate and Rhythm: Regular rhythm. Tachycardia present.  Pulmonary:     Effort:  Pulmonary effort is normal.     Breath sounds: Wheezing present.  Neurological:     Mental Status: She is alert.  Psychiatric:        Mood and Affect: Mood normal.        Behavior: Behavior normal.    UC Treatments / Results  Labs (all labs ordered are listed, but only abnormal results are displayed) Labs Reviewed - No data to display  EKG   Radiology No results found.  Procedures Procedures (including critical care time)  Medications Ordered in UC Medications - No data to display  Initial Impression / Assessment and Plan / UC Course  I have reviewed the triage vital signs and the nursing notes.  Pertinent labs & imaging results that were available during my care of the patient were reviewed by me and considered in my medical decision making (see chart for details).    24 year old female presents with a respiratory tract infection.  Respiratory tract infection has flared her asthma.  Acute exacerbation.  Doxycycline and prednisone as directed.  Continue home inhalers.  Supportive care.  Final Clinical Impressions(s) / UC Diagnoses   Final diagnoses:  Respiratory infection  Asthma with acute exacerbation, unspecified asthma severity, unspecified whether persistent     Discharge Instructions     Medication as prescribed.  Take care  Dr. Lacinda Axon     ED Prescriptions    Medication Sig Dispense Auth. Provider   doxycycline (VIBRAMYCIN) 100 MG capsule Take 1 capsule (100 mg total) by mouth 2 (two) times daily. 14 capsule Tannon Peerson G, DO   predniSONE (DELTASONE) 50 MG tablet 1 tablet daily x 5 days 5 tablet Thersa Salt G, DO     PDMP not reviewed this encounter.   Coral Spikes, DO 11/02/19 1344

## 2019-11-02 NOTE — ED Triage Notes (Signed)
Pt presents with cough, nasal congestion, lack of taste x 4 days.  Facial/sinus pain.  No fever, chills.  Was tested on 10/31/2019 and received negative result today.  Has taken Mucinex, Delsym, and Robitussin with no relief.  No known exposure. Does not wish for another COVID test at this time.

## 2019-11-02 NOTE — Discharge Instructions (Signed)
Medication as prescribed.  Take care  Dr. Damoni Causby  

## 2020-07-13 ENCOUNTER — Ambulatory Visit (LOCAL_COMMUNITY_HEALTH_CENTER): Payer: Medicaid Other | Admitting: Physician Assistant

## 2020-07-13 ENCOUNTER — Encounter: Payer: Self-pay | Admitting: Physician Assistant

## 2020-07-13 ENCOUNTER — Other Ambulatory Visit: Payer: Self-pay

## 2020-07-13 ENCOUNTER — Ambulatory Visit: Payer: Self-pay

## 2020-07-13 VITALS — BP 124/83 | Ht 65.0 in | Wt 298.0 lb

## 2020-07-13 DIAGNOSIS — Z3009 Encounter for other general counseling and advice on contraception: Secondary | ICD-10-CM

## 2020-07-13 DIAGNOSIS — Z01419 Encounter for gynecological examination (general) (routine) without abnormal findings: Secondary | ICD-10-CM | POA: Diagnosis not present

## 2020-07-13 DIAGNOSIS — Z299 Encounter for prophylactic measures, unspecified: Secondary | ICD-10-CM

## 2020-07-13 DIAGNOSIS — Z113 Encounter for screening for infections with a predominantly sexual mode of transmission: Secondary | ICD-10-CM

## 2020-07-13 DIAGNOSIS — Z30011 Encounter for initial prescription of contraceptive pills: Secondary | ICD-10-CM

## 2020-07-13 LAB — WET PREP FOR TRICH, YEAST, CLUE
Trichomonas Exam: NEGATIVE
Yeast Exam: NEGATIVE

## 2020-07-13 LAB — PREGNANCY, URINE: Preg Test, Ur: NEGATIVE

## 2020-07-13 MED ORDER — ACYCLOVIR 800 MG PO TABS: 800.0000 mg | ORAL_TABLET | Freq: Every day | ORAL | 12 refills | Status: DC

## 2020-07-13 MED ORDER — NORGESTIMATE-ETH ESTRADIOL 0.25-35 MG-MCG PO TABS: 1.0000 | ORAL_TABLET | Freq: Every day | ORAL | 3 refills | Status: DC

## 2020-07-13 NOTE — Progress Notes (Signed)
Pt was scheduled as a FP Problem visit. Pt reports she is here for STD screening and a pregnancy test. Pt reports periods are usually irregular and last LMP may have been ~2 months ago that was normal. Pt reports is not on a birth control method but may be interested in getting on birth control pills.

## 2020-07-15 NOTE — Progress Notes (Signed)
[facility] problem visit  [place] [facility]  Subjective:  Susan Swanson is a 24 y.o. being seen today for STD screening and pregnancy test.  Chief Complaint  Patient presents with  . Contraception    STD screening and pregnancy test    HPI  Patient states that she usually has irregular periods and thinks that she last had one 2 months ago.  Reports that she was previously on OCs without problems but d/c due to not being sexually active at that time.  Reports that she recently met someone and became sexually active so would like a screening, pregnancy test and to restart OCs.  Also, requests Rx for suppressive treatment for HSV.  Denies vaginal symptoms.   Does the patient have a current or past history of drug use? No   No components found for: HCV]   Health Maintenance Due  Topic Date Due  . Hepatitis C Screening  Never done  . COVID-19 Vaccine (1) Never done  . INFLUENZA VACCINE  04/01/2020  . PAP-Cervical Cytology Screening  06/16/2020  . PAP SMEAR-Modifier  06/16/2020    Review of Systems  All other systems reviewed and are negative.   The following portions of the patient's history were reviewed and updated as appropriate: allergies, current medications, past family history, past medical history, past social history, past surgical history and problem list. Problem list updated.   See flowsheet for other program required questions.  Objective:   Vitals:   07/13/20 1327  BP: 124/83  Weight: 298 lb (135.2 kg)  Height: _0  (1.651 m)    Physical Exam Vitals and nursing note reviewed.  Constitutional:      General: She is not in acute distress.    Appearance: Normal appearance. She is obese.  HENT:     Head: Normocephalic and atraumatic.     Mouth/Throat:     Mouth: Mucous membranes are moist.     Pharynx: Oropharynx is clear. No oropharyngeal exudate or posterior oropharyngeal erythema.  Eyes:     Conjunctiva/sclera:  Conjunctivae normal.  Pulmonary:     Effort: Pulmonary effort is normal.  Abdominal:     Palpations: Abdomen is soft. There is no mass.     Tenderness: There is no abdominal tenderness. There is no guarding or rebound.  Genitourinary:    General: Normal vulva.     Rectum: Normal.     Comments: External genitalia/pubic area without nits, lice, edema, erythema, lesions and inguinal adenopathy. Vagina with normal mucosa and discharge. Cervix without visible lesions. Uterus firm, mobile, nt, no masses, no CMT, no adnexal tenderness or fullness. Musculoskeletal:     Cervical back: Neck supple. No tenderness.  Skin:    General: Skin is warm and dry.     Findings: No bruising, erythema, lesion or rash.  Neurological:     Mental Status: She is alert and oriented to person, place, and time.  Psychiatric:        Mood and Affect: Mood normal.        Behavior: Behavior normal.        Thought Content: Thought content normal.        Judgment: Judgment normal.       Assessment and Plan:  Susan Swanson is a 24 y.o. female presenting to the [facility] for a Women's Health problem visit  1. Encounter for counseling regarding contraception Reviewed with patient risks, benefits, and SE of OCs. Counseled that even  if she is not sexually active, it is OK to continue with OCs to keep periods regular. Enc condoms with all sex for STD protection, if late OCs. - Pregnancy, urine  2. Screening for STD (sexually transmitted disease) Await test results.  Counseled that RN will call if needs to RTC for treatment once results are back.  - Chlamydia/Gonorrhea [facility] - HIV [facility] - Syphilis Serology, [facility] - WET PREP FOR [place], YEAST, CLUE  3. Pap smear, as part of routine gynecological examination Await results from pap.  Counseled that RN will call or send letter once results are back.  - IGP, rfx Aptima HPV ASCU  4. OCP (oral contraceptive pills)  initiation With pregnancy test being negative, patient may start Sprintec 28d 1 po daily at the same time each day today. Enc condoms with all sex for 2 weeks of first cycle.  Rx sent to pharmacy and patient to RTC for RP appointment before running out of OCs. - norgestimate-ethinyl estradiol ([place] 28) 0.25-35 MG-MCG tablet; Take 1 tablet by mouth daily.  Dispense: 28 tablet; Refill: 3  5. Prophylactic measure Rx for Acyclovir 800 mg #30 1 po daily with refills for 1 year sent to pharmacy per patient request. - acyclovir (ZOVIRAX) 800 MG tablet; Take 1 tablet (800 mg total) by mouth daily.  Dispense: 30 tablet; Refill: 12     Return in about 4 months (around 11/10/2020) for RP and prn.  No future appointments.  Jerene Dilling, PA

## 2020-07-16 LAB — IGP, RFX APTIMA HPV ASCU: PAP Smear Comment: 0

## 2020-07-17 ENCOUNTER — Ambulatory Visit
Admission: RE | Admit: 2020-07-17 | Discharge: 2020-07-17 | Disposition: A | Discharge status: Home / Self Care | Payer: Medicaid Other | Source: Ambulatory Visit | Attending: Emergency Medicine | Admitting: Emergency Medicine

## 2020-07-17 ENCOUNTER — Other Ambulatory Visit: Payer: Self-pay

## 2020-07-17 VITALS — BP 133/79 | HR 81 | Temp 97.8°F | Resp 18 | Ht 65.0 in | Wt 300.0 lb

## 2020-07-17 DIAGNOSIS — Z20822 Contact with and (suspected) exposure to covid-19: Secondary | ICD-10-CM | POA: Diagnosis not present

## 2020-07-17 DIAGNOSIS — B349 Viral infection, unspecified: Secondary | ICD-10-CM | POA: Diagnosis not present

## 2020-07-17 DIAGNOSIS — R519 Headache, unspecified: Secondary | ICD-10-CM | POA: Diagnosis not present

## 2020-07-17 LAB — RESPIRATORY PANEL BY RT PCR (FLU A&B, COVID)
Influenza A by PCR: NEGATIVE
Influenza B by PCR: NEGATIVE
SARS Coronavirus 2 by RT PCR: NEGATIVE

## 2020-07-17 LAB — GROUP A STREP BY PCR: Group A Strep by PCR: NOT DETECTED

## 2020-07-17 LAB — PREGNANCY, URINE: Preg Test, Ur: NEGATIVE

## 2020-07-17 MED ORDER — IBUPROFEN 600 MG PO TABS: 600.0000 mg | ORAL_TABLET | Freq: Four times a day (QID) | ORAL | 0 refills | Status: DC | PRN

## 2020-07-17 MED ORDER — ONDANSETRON 8 MG PO TBDP: ORAL_TABLET | ORAL | 0 refills | Status: DC

## 2020-07-17 NOTE — Discharge Instructions (Addendum)
Your Covid and flu testing will be back tomorrow.  Urine pregnancy is negative.  You may be a candidate for monoclonal antibody infusion if your Covid test comes back positive.  Take Zofran for nausea, will also cause constipation.  If you are still having issues with diarrhea, try some Imodium.  Push electrolyte containing fluids such as Pedialyte.  Take 600 mg) combined with 1000 mg Tylenol together 3-4 times a day as needed.  Primary care physician of your choice, see list below.  Here is a list of primary care providers who are taking new patients:  Dr. Elizabeth Sauer [address] [place] [address] [phone]  [facility] [address] [place], [place] [address] [phone]  [facility] [place] [address] [place] [address]  [phone]  [facility] [address] [place], [place] [address] [phone]  [facility] [address]  [phone] [place], [place] [address]  Go to www.goodrx.com to look up your medications. This will give you a list of where you can find your prescriptions at the most affordable prices. Or ask the pharmacist what the cash price is, or if they have any other discount programs available to help make your medication more affordable. This can be less expensive than what you would pay with insurance.

## 2020-07-17 NOTE — ED Triage Notes (Signed)
Patient complains of sore throat, cough and abdominal pain with a headache x 1 week. Patient has not been covid tested since onset.

## 2020-07-17 NOTE — ED Provider Notes (Signed)
HPI  SUBJECTIVE:  Susan Swanson is a 24 y.o. female who presents with 1 week of a constant, daily, throbbing headache located along her temples, 3-4 episodes of watery nonbloody diarrhea per day, cough at night, nausea in the morning, 1-3 episodes of emesis in the morning.  She reports sore throat starting yesterday.  No abdominal pain.  No fevers, body aches, nasal congestion, sinus pain or pressure, rhinorrhea, loss of sense of smell or taste, shortness of breath, postnasal drip.  She denies photophobia, neck stiffness, rash, voice changes, sensation of throat swelling shut, blurry vision, double vision.  She states that this is similar to previous headaches, but this is different in that it is not resolving.  She denies arm or leg weakness, facial droop, slurred speech, discoordination.  She did not get the Covid vaccine.  No known Covid exposure, however she has multiple family members in the house with similar symptoms.  No antibiotics in the past month.  No antipyretic in the past 6 hours.  She has tried ibuprofen 800 mg every 6 hours, Tylenol every 6 hours, Pepto-Bismol, BC powders, Goody powders.  No aggravating or alleviating factors.  She has a past medical history of asthma, she is a smoker.  She has history of frequent sinusitis.  No history of migraines, atrial fibrillation, stroke, aneurysm.  LMP: irregular.  States that she had a negative pregnancy test at the health department 4 days ago.  PMD: None.    Past Medical History:  Diagnosis Date  . Anxiety   . Asthma   . Syncope     Past Surgical History:  Procedure Laterality Date  . CESAREAN SECTION      Family History  Problem Relation Age of Onset  . Osteoarthritis Mother   . Depression Mother   . Healthy Father     Social History   Tobacco Use  . Smoking status: Current Every Day Smoker    Packs/day: 0.50    Types: Cigarettes  . Smokeless tobacco: Never Used  Vaping Use  . Vaping Use: Never used  Substance  Use Topics  . Alcohol use: Yes    Comment: occasionally  . Drug use: Not Currently    Comment: pt states she has a hx of drug addiciton; pt states she used any and everything.     No current facility-administered medications for this encounter.  Current Outpatient Medications:  .  albuterol (VENTOLIN HFA) 108 (90 Base) MCG/ACT inhaler, Inhale 1-2 puffs into the lungs every 6 (six) hours as needed for wheezing or shortness of breath., Disp: 18 g, Rfl: 0 .  norgestimate-ethinyl estradiol (SPRINTEC 28) 0.25-35 MG-MCG tablet, Take 1 tablet by mouth daily., Disp: 28 tablet, Rfl: 3 .  budesonide-formoterol (SYMBICORT) 80-4.5 MCG/ACT inhaler, Inhale 2 puffs into the lungs 2 (two) times daily. (Patient not taking: Reported on 07/13/2020), Disp: 1 Inhaler, Rfl: 12 .  ibuprofen (ADVIL) 600 MG tablet, Take 1 tablet (600 mg total) by mouth every 6 (six) hours as needed., Disp: 30 tablet, Rfl: 0 .  ondansetron (ZOFRAN ODT) 8 MG disintegrating tablet, 1/2- 1 tablet q 8 hr prn nausea, vomiting, Disp: 20 tablet, Rfl: 0  No Known Allergies   ROS  As noted in HPI.   Physical Exam  BP 133/79 (BP Location: Left Arm)   Pulse 81   Temp 97.8 F (36.6 C) (Oral)   Resp 18   Ht 5\' 5"  (1.651 m)   Wt 136.1 kg   SpO2 99%   BMI  49.92 kg/m   Constitutional: Well developed, well nourished, no acute distress.  Appears tired. Eyes: PERRL, EOMI, conjunctiva normal bilaterally.  No photophobia. HENT: Normocephalic, atraumatic,mucus membranes moist.  TMs normal bilaterally.  No pain with traction on pinna, palpation of mastoid.  Mild TMJ tenderness bilaterally.  No crepitus.  Normal turbinates.  Mucoid nasal congestion.  Positive maxillary, frontal sinus tenderness.  Slightly erythematous oropharynx, tonsils normal size without exudates.  No obvious postnasal drip.  No temporal artery tenderness. Neck: Bilateral trapezial tenderness.  No cervical lymphadenopathy, meningismus. Respiratory: Clear to auscultation  bilaterally, no rales, no wheezing, no rhonchi Cardiovascular: Normal rate and rhythm, no murmurs, no gallops, no rubs GI: Soft, nondistended, normal bowel sounds, nontender, no rebound, no guarding Back: no CVAT skin: No rash, skin intact Musculoskeletal: No edema, no tenderness, no deformities Neurologic: Alert & oriented x 3, CN III-XII  intact, no motor deficits, coronation grossly normal, sensation grossly intact Psychiatric: Speech and behavior appropriate   ED Course   Medications - No data to display  Orders Placed This Encounter  Procedures  . Group A Strep by PCR    Standing Status:   Standing    Number of Occurrences:   1  . Respiratory Panel by RT PCR (Flu A&B, Covid) - Nasopharyngeal Swab    Standing Status:   Standing    Number of Occurrences:   1    Order Specific Question:   Is this test for diagnosis or screening    Answer:   Diagnosis of ill patient    Order Specific Question:   Symptomatic for COVID-19 as defined by CDC    Answer:   Yes    Order Specific Question:   Date of Symptom Onset    Answer:   07/10/2020    Order Specific Question:   Hospitalized for COVID-19    Answer:   No    Order Specific Question:   Admitted to ICU for COVID-19    Answer:   No    Order Specific Question:   Previously tested for COVID-19    Answer:   No    Order Specific Question:   Resident in a congregate (group) care setting    Answer:   No    Order Specific Question:   Employed in healthcare setting    Answer:   No    Order Specific Question:   Pregnant    Answer:   Unknown    Order Specific Question:   Has patient completed COVID vaccination(s) (2 doses of Pfizer/Moderna 1 dose of [employer])    Answer:   No  . Pregnancy, urine    Standing Status:   Standing    Number of Occurrences:   1   No results found for this or any previous visit (from the past 24 hour(s)). No results found. Results for orders placed or performed during the hospital encounter of 07/17/20   Group A Strep by PCR   Specimen: Throat; Sterile Swab  Result Value Ref Range   Group A Strep by PCR NOT DETECTED NOT DETECTED  Respiratory Panel by RT PCR (Flu A&B, Covid) - Nasopharyngeal Swab   Specimen: Nasopharyngeal Swab  Result Value Ref Range   SARS Coronavirus 2 by RT PCR NEGATIVE NEGATIVE   Influenza A by PCR NEGATIVE NEGATIVE   Influenza B by PCR NEGATIVE NEGATIVE  Pregnancy, urine  Result Value Ref Range   Preg Test, Ur NEGATIVE NEGATIVE    ED Clinical Impression  1.  Viral illness   2. Acute nonintractable headache, unspecified headache type   3. Encounter for laboratory testing for COVID-19 virus      ED Assessment/Plan  Urine pregnancy negative.  Strep PCR negative.  Suspect patient's headache is a tension type or musculoskeletal headache.  It does not appear to be a sinusitis although she does have sinus tenderness.  Deferring antibiotic treatment today for sinusitis as I am not 100% convinced that is the cause of her headache.  She has trapezial tenderness bilaterally.  No evidence of temporal arteritis, stroke, meningitis.  Doubt mass.  She has multiple sick contacts with diarrhea.  While her vitals are normal, she could be slightly dehydrated and she states that she has not been getting a lot of sleep recently which could be aggravating her headache.   Checking Covid, flu.  If Covid is positive she will be a candidate for monoclonal antibody infusion based on her BMI and history of asthma.  She is out of the window for Tamiflu.  Sending home with Tylenol/ibuprofen, Zofran, Imodium, push electrolyte containing fluids.  Primary care list.   Discussed labs,  MDM, treatment plan, and plan for follow-up with patient Discussed sn/sx that should prompt return to the ED. patient agrees with plan.   Meds ordered this encounter  Medications  . ondansetron (ZOFRAN ODT) 8 MG disintegrating tablet    Sig: 1/2- 1 tablet q 8 hr prn nausea, vomiting    Dispense:  20 tablet     Refill:  0  . ibuprofen (ADVIL) 600 MG tablet    Sig: Take 1 tablet (600 mg total) by mouth every 6 (six) hours as needed.    Dispense:  30 tablet    Refill:  0    *This clinic note was created using Scientist, clinical (histocompatibility and immunogenetics). Therefore, there may be occasional mistakes despite careful proofreading.  ?    Domenick Gong, MD 07/19/20 [phone]

## 2020-08-08 ENCOUNTER — Other Ambulatory Visit: Payer: Self-pay

## 2020-08-08 ENCOUNTER — Ambulatory Visit
Admission: RE | Admit: 2020-08-08 | Discharge: 2020-08-08 | Disposition: A | Discharge status: Home / Self Care | Payer: Medicaid Other | Source: Ambulatory Visit | Attending: Family Medicine | Admitting: Family Medicine

## 2020-08-08 ENCOUNTER — Ambulatory Visit (INDEPENDENT_AMBULATORY_CARE_PROVIDER_SITE_OTHER): Payer: Medicaid Other

## 2020-08-08 VITALS — BP 131/74 | HR 102 | Temp 98.1°F | Resp 18 | Ht 65.0 in | Wt 300.0 lb

## 2020-08-08 DIAGNOSIS — Z793 Long term (current) use of hormonal contraceptives: Secondary | ICD-10-CM | POA: Diagnosis not present

## 2020-08-08 DIAGNOSIS — Z20822 Contact with and (suspected) exposure to covid-19: Secondary | ICD-10-CM | POA: Diagnosis not present

## 2020-08-08 DIAGNOSIS — F17210 Nicotine dependence, cigarettes, uncomplicated: Secondary | ICD-10-CM | POA: Insufficient documentation

## 2020-08-08 DIAGNOSIS — R0602 Shortness of breath: Secondary | ICD-10-CM

## 2020-08-08 DIAGNOSIS — R051 Acute cough: Secondary | ICD-10-CM | POA: Diagnosis present

## 2020-08-08 DIAGNOSIS — R059 Cough, unspecified: Secondary | ICD-10-CM | POA: Diagnosis not present

## 2020-08-08 DIAGNOSIS — J069 Acute upper respiratory infection, unspecified: Secondary | ICD-10-CM | POA: Insufficient documentation

## 2020-08-08 DIAGNOSIS — Z79899 Other long term (current) drug therapy: Secondary | ICD-10-CM | POA: Insufficient documentation

## 2020-08-08 DIAGNOSIS — J40 Bronchitis, not specified as acute or chronic: Secondary | ICD-10-CM | POA: Diagnosis not present

## 2020-08-08 LAB — RESP PANEL BY RT-PCR (FLU A&B, COVID) ARPGX2
Influenza A by PCR: NEGATIVE
Influenza B by PCR: NEGATIVE
SARS Coronavirus 2 by RT PCR: NEGATIVE

## 2020-08-08 MED ORDER — AEROCHAMBER MV MISC: 2 refills | Status: DC

## 2020-08-08 MED ORDER — PREDNISONE 10 MG (21) PO TBPK: ORAL_TABLET | Freq: Every day | ORAL | 0 refills | Status: DC

## 2020-08-08 MED ORDER — ALBUTEROL SULFATE HFA 108 (90 BASE) MCG/ACT IN AERS: 2.0000 | INHALATION_SPRAY | RESPIRATORY_TRACT | 0 refills | Status: DC | PRN

## 2020-08-08 MED ORDER — PROMETHAZINE-DM 6.25-15 MG/5ML PO SYRP: 5.0000 mL | ORAL_SOLUTION | Freq: Four times a day (QID) | ORAL | 0 refills | Status: DC | PRN

## 2020-08-08 MED ORDER — DOXYCYCLINE HYCLATE 100 MG PO CAPS: 100.0000 mg | ORAL_CAPSULE | Freq: Two times a day (BID) | ORAL | 0 refills | Status: DC

## 2020-08-08 MED ORDER — BENZONATATE 100 MG PO CAPS: 200.0000 mg | ORAL_CAPSULE | Freq: Three times a day (TID) | ORAL | 0 refills | Status: DC

## 2020-08-08 NOTE — ED Provider Notes (Addendum)
[facility]    CSN: [id number] Arrival date & time: 08/08/20  1539      History   Chief Complaint Chief Complaint  Patient presents with  . Appointment  . Nasal Congestion  . Cough    HPI Susan Swanson is a 24 y.o. female.   HPI   24 year old female here for evaluation of cough, nasal congestion, shortness of breath, wheezing, nausea, and body aches.  Patient reports that she has been having symptoms since she was diagnosed with a viral illness on July 17, 2020.  Patient states that she is not having any nasal discharge, no fever, no ear pain or pressure, denies sore throat, vomiting, diarrhea, abdominal pain, or changes to her sense of taste and smell.  Past Medical History:  Diagnosis Date  . Anxiety   . Asthma   . Syncope     Patient Active Problem List   Diagnosis Date Noted  . Morbid obesity with BMI of 50.0-59.9, adult ([place]) 08/12/2018  . History of herpes genitalis 12/25/2017  . History of asthma 06/16/2017  . Hx of self-harm 06/16/2017  . Recurrent major depressive disorder, in full remission ([place]) 11/09/2014  . Asthma 11/23/2012    Past Surgical History:  Procedure Laterality Date  . CESAREAN SECTION      OB History   No obstetric history on file.      Home Medications    Prior to Admission medications   Medication Sig Start Date End Date Taking? Authorizing Provider  norgestimate-ethinyl estradiol (SPRINTEC 28) 0.25-35 MG-MCG tablet Take 1 tablet by mouth daily. 07/13/20  Yes Hampton, Burman Blacksmith, PA  albuterol (VENTOLIN HFA) 108 (90 Base) MCG/ACT inhaler Inhale 2 puffs into the lungs every 4 (four) hours as needed for wheezing or shortness of breath. 08/08/20   Margarette Canada, NP  benzonatate (TESSALON) 100 MG capsule Take 2 capsules (200 mg total) by mouth every 8 (eight) hours. 08/08/20   Margarette Canada, NP  predniSONE (STERAPRED UNI-PAK 21 TAB) 10 MG (21) TBPK tablet Take by mouth daily. Take 6 tabs by mouth daily  for 2 days, then  5 tabs for 2 days, then 4 tabs for 2 days, then 3 tabs for 2 days, 2 tabs for 2 days, then 1 tab by mouth daily for 2 days 08/08/20   Margarette Canada, NP  promethazine-dextromethorphan (PROMETHAZINE-DM) 6.25-15 MG/5ML syrup Take 5 mLs by mouth 4 (four) times daily as needed. 08/08/20   Margarette Canada, NP  Spacer/Aero-Holding Josiah Lobo (AEROCHAMBER MV) inhaler Use as instructed 08/08/20   Margarette Canada, NP  budesonide-formoterol [facility]) 80-4.5 MCG/ACT inhaler Inhale 2 puffs into the lungs 2 (two) times daily. Patient not taking: Reported on 07/13/2020 08/09/18 08/08/20  Coral Spikes, DO  norethindrone (JENCYCLA) 0.35 MG tablet Take 1 tablet by mouth daily.  05/23/19  [provider]    Family History Family History  Problem Relation Age of Onset  . Osteoarthritis Mother   . Depression Mother   . Healthy Father     Social History Social History   Tobacco Use  . Smoking status: Current Every Day Smoker    Packs/day: 0.50    Types: Cigarettes  . Smokeless tobacco: Never Used  Vaping Use  . Vaping Use: Never used  Substance Use Topics  . Alcohol use: Yes    Comment: occasionally  . Drug use: Not Currently    Comment: pt states she has a hx of drug addiciton; pt states she used any and everything.  Allergies   Patient has no known allergies.   Review of Systems Review of Systems  Constitutional: Negative for activity change, appetite change and fever.  HENT: Positive for congestion and postnasal drip. Negative for ear pain, rhinorrhea, sinus pressure, sinus pain and sore throat.   Respiratory: Positive for cough. Negative for shortness of breath and wheezing.   Cardiovascular: Negative for chest pain.  Gastrointestinal: Negative for abdominal pain, diarrhea, nausea and vomiting.  Musculoskeletal: Positive for arthralgias and myalgias.  Skin: Negative for rash.  Hematological: Negative.   Psychiatric/Behavioral: Negative.      Physical Exam Triage Vital Signs ED  Triage Vitals  Enc Vitals Group     BP 08/08/20 1657 131/74     Pulse Rate 08/08/20 1657 (!) 102     Resp 08/08/20 1657 18     Temp 08/08/20 1657 98.1 F (36.7 C)     Temp Source 08/08/20 1657 Oral     SpO2 08/08/20 1657 99 %     Weight 08/08/20 1655 300 lb (136.1 kg)     Height 08/08/20 1655 '5\' 5"'  (1.651 m)     Head Circumference --      Peak Flow --      Pain Score 08/08/20 1655 0     Pain Loc --      Pain Edu? --      Excl. in [place]? --    No data found.  Updated Vital Signs BP 131/74 (BP Location: Right Arm)   Pulse (!) 102   Temp 98.1 F (36.7 C) (Oral)   Resp 18   Ht '5\' 5"'  (1.651 m)   Wt 300 lb (136.1 kg)   LMP 08/01/2020   SpO2 99%   BMI 49.92 kg/m   Visual Acuity Right Eye Distance:   Left Eye Distance:   Bilateral Distance:    Right Eye Near:   Left Eye Near:    Bilateral Near:     Physical Exam Vitals and nursing note reviewed.  Constitutional:      General: She is not in acute distress.    Appearance: Normal appearance. She is not toxic-appearing.  HENT:     Head: Normocephalic and atraumatic.     Right Ear: Tympanic membrane, ear canal and external ear normal.     Left Ear: Tympanic membrane, ear canal and external ear normal.     Nose: Congestion and rhinorrhea present.     Comments: His mucosa in left nare is markedly edematous and erythematous with clear nasal discharge.  Right nare is erythematous with mild edema and clear nasal discharge.    Mouth/Throat:     Mouth: Mucous membranes are moist.     Pharynx: Oropharynx is clear. Posterior oropharyngeal erythema present.     Comments: Posterior oropharynx is erythematous with mild injection and clear postnasal drip. Eyes:     General: No scleral icterus.    Extraocular Movements: Extraocular movements intact.     Conjunctiva/sclera: Conjunctivae normal.     Pupils: Pupils are equal, round, and reactive to light.  Cardiovascular:     Rate and Rhythm: Normal rate and regular rhythm.     Pulses:  Normal pulses.     Heart sounds: Normal heart sounds. No murmur heard.  No gallop.   Pulmonary:     Effort: Pulmonary effort is normal.     Breath sounds: Wheezing and rhonchi present. No rales.     Comments: Patient has bilateral wheezes and rhonchi in upper and middle lobes. Musculoskeletal:  General: No swelling or tenderness. Normal range of motion.     Cervical back: Normal range of motion and neck supple.  Lymphadenopathy:     Cervical: No cervical adenopathy.  Skin:    Capillary Refill: Capillary refill takes less than 2 seconds.     Findings: No erythema or rash.  Neurological:     General: No focal deficit present.     Mental Status: She is alert and oriented to person, place, and time.  Psychiatric:        Mood and Affect: Mood normal.        Behavior: Behavior normal.        Thought Content: Thought content normal.        Judgment: Judgment normal.      UC Treatments / Results  Labs (all labs ordered are listed, but only abnormal results are displayed) Labs Reviewed  RESP PANEL BY RT-PCR (FLU A&B, COVID) [id number]    EKG   Radiology DG Chest 2 View  Result Date: 08/08/2020 CLINICAL DATA:  Shortness of breath with cough EXAM: CHEST - 2 VIEW COMPARISON:  11/02/2019 FINDINGS: The heart size and mediastinal contours are within normal limits. Both lungs are clear. The visualized skeletal structures are unremarkable. IMPRESSION: No active cardiopulmonary disease. Electronically Signed   By: Donavan Foil M.D.   On: 08/08/2020 17:50    Procedures Procedures (including critical care time)  Medications Ordered in UC Medications - No data to display  Initial Impression / Assessment and Plan / UC Course  I have reviewed the triage vital signs and the nursing notes.  Pertinent labs & imaging results that were available during my care of the patient were reviewed by me and considered in my medical decision making (see chart for details).   Here for continued  cough and nasal congestion with associated shortness of breath and wheezing for the past 3 weeks.  Patient has inflamed nasal mucosa and clear nasal discharge with clear postnasal drip.  Patient does have pain to percussion to maxillary sinuses no cervical lymphadenopathy.  Patient's lung sounds reveal wheezes and rhonchi in bilateral upper and middle lobes.  Physical exam findings are consistent with URI and bronchitis.  Triplex respiratory panel sent at triage.  Chest x-ray independently evaluated by me.  No evidence of pneumonia or pneumothorax noted.  Will await radiology overread.  Radiology concurs with my read.  Respiratory triplex panel is negative for Covid or flu.  Will diagnose patient with bronchitis and treat with albuterol inhaler, AeroChamber, prednisone, Tessalon Perles, and Promethazine DM.  To protracted length of time will also give doxycycline twice a day for 10 days.   Final Clinical Impressions(s) / UC Diagnoses   Final diagnoses:  Upper respiratory tract infection, unspecified type  Bronchitis     Discharge Instructions     You do not have Covid or flu.  Perform sinus irrigation 2-3 times a day with a [employer] sinus rinse kit and distilled water.  Use the Tessalon Perles every 8 hours during the day as needed for cough.  Take them with a small sip of water.  They may give you a metallic taste in your mouth and numbness to the base of your tongue.  This is normal.  Use the cough syrup every 6 hours as needed for cough and congestion.  This will make you drowsy so you may want to save it for bedtime.  Use the albuterol inhaler with spacer, 2 puffs every 4-6 hours, as needed for  shortness of breath and wheezing.  Use over-the-counter Tylenol and ibuprofen as needed for pain and fever.  If your symptoms continue follow-up with your primary care provider.    ED Prescriptions    Medication Sig Dispense Auth. Provider   predniSONE (STERAPRED UNI-PAK 21 TAB) 10  MG (21) TBPK tablet Take by mouth daily. Take 6 tabs by mouth daily  for 2 days, then 5 tabs for 2 days, then 4 tabs for 2 days, then 3 tabs for 2 days, 2 tabs for 2 days, then 1 tab by mouth daily for 2 days 42 tablet Margarette Canada, NP   albuterol (VENTOLIN HFA) 108 (90 Base) MCG/ACT inhaler Inhale 2 puffs into the lungs every 4 (four) hours as needed for wheezing or shortness of breath. 18 g Margarette Canada, NP   Spacer/Aero-Holding Chambers (AEROCHAMBER MV) inhaler Use as instructed 1 each Margarette Canada, NP   benzonatate (TESSALON) 100 MG capsule Take 2 capsules (200 mg total) by mouth every 8 (eight) hours. 21 capsule Margarette Canada, NP   promethazine-dextromethorphan (PROMETHAZINE-DM) 6.25-15 MG/5ML syrup Take 5 mLs by mouth 4 (four) times daily as needed. 118 mL Margarette Canada, NP     PDMP not reviewed this encounter.   Margarette Canada, NP 08/08/20 1807    Margarette Canada, NP 08/08/20 1810

## 2020-08-08 NOTE — ED Triage Notes (Signed)
Patient c/o cough and nasal congestion that started on Saturday. Denies fever.

## 2020-08-08 NOTE — Discharge Instructions (Signed)
You do not have Covid or flu.  Perform sinus irrigation 2-3 times a day with a [employer]Med sinus rinse kit and distilled water.  Use the Tessalon Perles every 8 hours during the day as needed for cough.  Take them with a small sip of water.  They may give you a metallic taste in your mouth and numbness to the base of your tongue.  This is normal.  Use the cough syrup every 6 hours as needed for cough and congestion.  This will make you drowsy so you may want to save it for bedtime.  Use the albuterol inhaler with spacer, 2 puffs every 4-6 hours, as needed for shortness of breath and wheezing.  Use over-the-counter Tylenol and ibuprofen as needed for pain and fever.  If your symptoms continue follow-up with your primary care provider.

## 2020-11-08 ENCOUNTER — Other Ambulatory Visit: Payer: Self-pay

## 2020-11-08 ENCOUNTER — Ambulatory Visit
Admission: RE | Admit: 2020-11-08 | Discharge: 2020-11-08 | Disposition: A | Discharge status: Home / Self Care | Payer: Medicaid Other | Source: Ambulatory Visit | Attending: Physician Assistant | Admitting: Physician Assistant

## 2020-11-08 VITALS — BP 105/69 | HR 100 | Temp 97.9°F | Resp 18 | Ht 65.0 in | Wt 300.0 lb

## 2020-11-08 DIAGNOSIS — F17210 Nicotine dependence, cigarettes, uncomplicated: Secondary | ICD-10-CM | POA: Insufficient documentation

## 2020-11-08 DIAGNOSIS — R059 Cough, unspecified: Secondary | ICD-10-CM | POA: Diagnosis not present

## 2020-11-08 DIAGNOSIS — R0981 Nasal congestion: Secondary | ICD-10-CM | POA: Insufficient documentation

## 2020-11-08 DIAGNOSIS — Z20822 Contact with and (suspected) exposure to covid-19: Secondary | ICD-10-CM | POA: Diagnosis not present

## 2020-11-08 DIAGNOSIS — J039 Acute tonsillitis, unspecified: Secondary | ICD-10-CM | POA: Insufficient documentation

## 2020-11-08 DIAGNOSIS — Z79899 Other long term (current) drug therapy: Secondary | ICD-10-CM | POA: Insufficient documentation

## 2020-11-08 LAB — PREGNANCY, URINE: Preg Test, Ur: NEGATIVE

## 2020-11-08 LAB — SARS CORONAVIRUS 2 (TAT 6-24 HRS): SARS Coronavirus 2: NEGATIVE

## 2020-11-08 LAB — GROUP A STREP BY PCR: Group A Strep by PCR: NOT DETECTED

## 2020-11-08 MED ORDER — PROMETHAZINE-DM 6.25-15 MG/5ML PO SYRP: 5.0000 mL | ORAL_SOLUTION | Freq: Every evening | ORAL | 0 refills | Status: AC

## 2020-11-08 MED ORDER — AMOXICILLIN 500 MG PO CAPS: 500.0000 mg | ORAL_CAPSULE | Freq: Two times a day (BID) | ORAL | 0 refills | Status: AC

## 2020-11-08 NOTE — Discharge Instructions (Signed)
I will call her with results of the strep test if positive.  If the test is negative it is still possible he could have strep throat so I have printed a prescription for amoxicillin for you.  Increase rest and fluids.  Also sent a cough medication for you as well.  You have received COVID testing today either for positive exposure, concerning symptoms that could be related to COVID infection, screening purposes, or re-testing after confirmed positive.  Your test obtained today checks for active viral infection in the last 1-2 weeks. If your test is negative now, you can still test positive later. So, if you do develop symptoms you should either get re-tested and/or isolate x 5 days and then strict mask use x 5 days (unvaccinated) or mask use x 10 days (vaccinated). Please follow CDC guidelines.  While Rapid antigen tests come back in 15-20 minutes, send out PCR/molecular test results typically come back within 1-3 days. In the mean time, if you are symptomatic, assume this could be a positive test and treat/monitor yourself as if you do have COVID.   We will call with test results if positive. Please download the MyChart app and set up a profile to access test results.   If symptomatic, go home and rest. Push fluids. Take Tylenol as needed for discomfort. Gargle warm salt water. Throat lozenges. Take Mucinex DM or Robitussin for cough. Humidifier in bedroom to ease coughing. Warm showers. Also review the COVID handout for more information.  COVID-19 INFECTION: The incubation period of COVID-19 is approximately 14 days after exposure, with most symptoms developing in roughly 4-5 days. Symptoms may range in severity from mild to critically severe. Roughly 80% of those infected will have mild symptoms. People of any age may become infected with COVID-19 and have the ability to transmit the virus. The most common symptoms include: fever, fatigue, cough, body aches, headaches, sore throat, nasal congestion,  shortness of breath, nausea, vomiting, diarrhea, changes in smell and/or taste.    COURSE OF ILLNESS Some patients may begin with mild disease which can progress quickly into critical symptoms. If your symptoms are worsening please call ahead to the Emergency Department and proceed there for further treatment. Recovery time appears to be roughly 1-2 weeks for mild symptoms and 3-6 weeks for severe disease.   GO IMMEDIATELY TO ER FOR FEVER YOU ARE UNABLE TO GET DOWN WITH TYLENOL, BREATHING PROBLEMS, CHEST PAIN, FATIGUE, LETHARGY, INABILITY TO EAT OR DRINK, ETC  QUARANTINE AND ISOLATION: To help decrease the spread of COVID-19 please remain isolated if you have COVID infection or are highly suspected to have COVID infection. This means -stay home and isolate to one room in the home if you live with others. Do not share a bed or bathroom with others while ill, sanitize and wipe down all countertops and keep common areas clean and disinfected. Stay home for 5 days. If you have no symptoms or your symptoms are resolving after 5 days, you can leave your house. Continue to wear a mask around others for 5 additional days. If you have been in close contact (within 6 feet) of someone diagnosed with COVID 19, you are advised to quarantine in your home for 14 days as symptoms can develop anywhere from 2-14 days after exposure to the virus. If you develop symptoms, you  must isolate.  Most current guidelines for COVID after exposure -unvaccinated: isolate 5 days and strict mask use x 5 days. Test on day 5 is possible -vaccinated:  wear mask x 10 days if symptoms do not develop -You do not necessarily need to be tested for COVID if you have + exposure and  develop symptoms. Just isolate at home x10 days from symptom onset During this global pandemic, CDC advises to practice social distancing, try to stay at least 13ft away from others at all times. Wear a face covering. Wash and sanitize your hands regularly and avoid  going anywhere that is not necessary.  KEEP IN MIND THAT THE COVID TEST IS NOT 100% ACCURATE AND YOU SHOULD STILL DO EVERYTHING TO PREVENT POTENTIAL SPREAD OF VIRUS TO OTHERS (WEAR MASK, WEAR GLOVES, WASH HANDS AND SANITIZE REGULARLY). IF INITIAL TEST IS NEGATIVE, THIS MAY NOT MEAN YOU ARE DEFINITELY NEGATIVE. MOST ACCURATE TESTING IS DONE 5-7 DAYS AFTER EXPOSURE.   It is not advised by CDC to get re-tested after receiving a positive COVID test since you can still test positive for weeks to months after you have already cleared the virus.   *If you have not been vaccinated for COVID, I strongly suggest you consider getting vaccinated as long as there are no contraindications.

## 2020-11-08 NOTE — ED Provider Notes (Signed)
[facility]    CSN: [id number] Arrival date & time: 11/08/20  1057      History   Chief Complaint Chief Complaint  Patient presents with  . Nasal Congestion  . Cough    HPI Susan Swanson is a 25 y.o. female presenting for 3-day history of fatigue, sore throat and painful swallowing, nasal congestion and cough.  Patient says she had a fever at onset.  She says she did not recorded temperature but woke up in a sweat.  Her child is ill with a fever currently.  Patient denies any known exposure to COVID-19.  She has not been vaccinated for COVID-19.  Has been taking over-the-counter cough syrup and Tylenol/ibuprofen as needed. Patient does have a history of asthma, but denies any breathing difficulty or chest tightness.  No other concerns.  HPI  Past Medical History:  Diagnosis Date  . Anxiety   . Asthma   . Syncope     Patient Active Problem List   Diagnosis Date Noted  . Morbid obesity with BMI of 50.0-59.9, adult (HCC) 08/12/2018  . History of herpes genitalis 12/25/2017  . History of asthma 06/16/2017  . Hx of self-harm 06/16/2017  . Recurrent major depressive disorder, in full remission (HCC) 11/09/2014  . Asthma 11/23/2012    Past Surgical History:  Procedure Laterality Date  . CESAREAN SECTION      OB History   No obstetric history on file.      Home Medications    Prior to Admission medications   Medication Sig Start Date End Date Taking? Authorizing Provider  amoxicillin (AMOXIL) 500 MG capsule Take 1 capsule (500 mg total) by mouth 2 (two) times daily for 10 days. 11/08/20 11/18/20 Yes Shirlee Latch, PA-C  promethazine-dextromethorphan (PROMETHAZINE-DM) 6.25-15 MG/5ML syrup Take 5 mLs by mouth at bedtime for 10 days. 11/08/20 11/18/20 Yes Shirlee Latch, PA-C  albuterol (VENTOLIN HFA) 108 (90 Base) MCG/ACT inhaler Inhale 2 puffs into the lungs every 4 (four) hours as needed for wheezing or shortness of breath. 08/08/20   Becky Augusta, NP   benzonatate (TESSALON) 100 MG capsule Take 2 capsules (200 mg total) by mouth every 8 (eight) hours. 08/08/20   Becky Augusta, NP  doxycycline (VIBRAMYCIN) 100 MG capsule Take 1 capsule (100 mg total) by mouth 2 (two) times daily. 08/08/20   Becky Augusta, NP  norgestimate-ethinyl estradiol (SPRINTEC 28) 0.25-35 MG-MCG tablet Take 1 tablet by mouth daily. 07/13/20   Matt Holmes, PA  predniSONE (STERAPRED UNI-PAK 21 TAB) 10 MG (21) TBPK tablet Take by mouth daily. Take 6 tabs by mouth daily  for 2 days, then 5 tabs for 2 days, then 4 tabs for 2 days, then 3 tabs for 2 days, 2 tabs for 2 days, then 1 tab by mouth daily for 2 days 08/08/20   Becky Augusta, NP  Spacer/Aero-Holding Chambers (AEROCHAMBER MV) inhaler Use as instructed 08/08/20   Becky Augusta, NP  budesonide-formoterol (SYMBICORT) 80-4.5 MCG/ACT inhaler Inhale 2 puffs into the lungs 2 (two) times daily. Patient not taking: Reported on 07/13/2020 08/09/18 08/08/20  Tommie Sams, DO  norethindrone (JENCYCLA) 0.35 MG tablet Take 1 tablet by mouth daily.  05/23/19  [provider]    Family History Family History  Problem Relation Age of Onset  . Osteoarthritis Mother   . Depression Mother   . Healthy Father     Social History Social History   Tobacco Use  . Smoking status: Current Every Day Smoker  Packs/day: 0.50    Types: Cigarettes  . Smokeless tobacco: Never Used  Vaping Use  . Vaping Use: Never used  Substance Use Topics  . Alcohol use: Yes    Comment: occasionally  . Drug use: Not Currently    Comment: pt states she has a hx of drug addiciton; pt states she used any and everything.      Allergies   Patient has no known allergies.   Review of Systems Review of Systems  Constitutional: Positive for fatigue. Negative for chills, diaphoresis and fever.  HENT: Positive for congestion, rhinorrhea and sore throat. Negative for ear pain, sinus pressure and sinus pain.   Respiratory: Positive for cough.  Negative for chest tightness, shortness of breath and wheezing.   Cardiovascular: Negative for chest pain.  Gastrointestinal: Negative for abdominal pain, nausea and vomiting.  Musculoskeletal: Negative for arthralgias and myalgias.  Skin: Negative for rash.  Neurological: Negative for weakness and headaches.  Hematological: Positive for adenopathy.     Physical Exam Triage Vital Signs ED Triage Vitals  Enc Vitals Group     BP 11/08/20 1121 105/69     Pulse Rate 11/08/20 1121 100     Resp 11/08/20 1121 18     Temp 11/08/20 1121 97.9 F (36.6 C)     Temp Source 11/08/20 1121 Oral     SpO2 11/08/20 1121 100 %     Weight 11/08/20 1118 (!) 300 lb 0.7 oz (136.1 kg)     Height 11/08/20 1118 5\' 5"  (1.651 m)     Head Circumference --      Peak Flow --      Pain Score 11/08/20 1118 0     Pain Loc --      Pain Edu? --      Excl. in GC? --    No data found.  Updated Vital Signs BP 105/69 (BP Location: Right Arm)   Pulse 100   Temp 97.9 F (36.6 C) (Oral)   Resp 18   Ht 5\' 5"  (1.651 m)   Wt (!) 300 lb 0.7 oz (136.1 kg)   LMP 09/09/2020 (Approximate)   SpO2 100%   BMI 49.93 kg/m        Physical Exam Vitals and nursing note reviewed.  Constitutional:      General: She is not in acute distress.    Appearance: Normal appearance. She is ill-appearing. She is not toxic-appearing or diaphoretic.  HENT:     Head: Normocephalic and atraumatic.     Right Ear: Tympanic membrane, ear canal and external ear normal.     Left Ear: Tympanic membrane, ear canal and external ear normal.     Nose: Nose normal.     Mouth/Throat:     Mouth: Mucous membranes are moist.     Pharynx: Oropharynx is clear. Posterior oropharyngeal erythema present.     Tonsils: Tonsillar exudate (whitish exudates on right) present. 2+ on the right. 1+ on the left.  Eyes:     General: No scleral icterus.       Right eye: No discharge.        Left eye: No discharge.     Conjunctiva/sclera: Conjunctivae  normal.  Cardiovascular:     Rate and Rhythm: Normal rate and regular rhythm.     Heart sounds: Normal heart sounds.  Pulmonary:     Effort: Pulmonary effort is normal. No respiratory distress.     Breath sounds: Normal breath sounds.  Musculoskeletal:  Cervical back: Neck supple.  Lymphadenopathy:     Cervical: Cervical adenopathy present.  Skin:    General: Skin is dry.  Neurological:     General: No focal deficit present.     Mental Status: She is alert. Mental status is at baseline.     Motor: No weakness.     Gait: Gait normal.  Psychiatric:        Mood and Affect: Mood normal.        Behavior: Behavior normal.        Thought Content: Thought content normal.      UC Treatments / Results  Labs (all labs ordered are listed, but only abnormal results are displayed) Labs Reviewed  SARS CORONAVIRUS 2 (TAT 6-24 HRS)  GROUP A STREP BY PCR  PREGNANCY, URINE    EKG   Radiology No results found.  Procedures Procedures (including critical care time)  Medications Ordered in UC Medications - No data to display  Initial Impression / Assessment and Plan / UC Course  I have reviewed the triage vital signs and the nursing notes.  Pertinent labs & imaging results that were available during my care of the patient were reviewed by me and considered in my medical decision making (see chart for details).   25 year old female presenting for sore throat and painful swallowing, nasal congestion and cough for about 3 days.  Child ill with similar symptoms and he has been febrile.  Exam significant for tonsillar enlargement and exudates as well as posterior pharyngeal erythema.  The rest exam is normal.  Molecular strep test obtained. Negative. Send out COVID-19 test performed.  Current CDC guidance, isolation protocol and ED precautions reviewed with patient.  Encouraged supportive care with increasing rest and fluids I did send Promethazine DM for her to take at bedtime for  the cough.  Patient's strep test was negative however her sounds is positive.  She is to fill the amoxicillin and start it because the strep test today was likely a false negative.   Final Clinical Impressions(s) / UC Diagnoses   Final diagnoses:  Acute tonsillitis, unspecified etiology  Nasal congestion  Cough     Discharge Instructions     I will call her with results of the strep test if positive.  If the test is negative it is still possible he could have strep throat so I have printed a prescription for amoxicillin for you.  Increase rest and fluids.  Also sent a cough medication for you as well.  You have received COVID testing today either for positive exposure, concerning symptoms that could be related to COVID infection, screening purposes, or re-testing after confirmed positive.  Your test obtained today checks for active viral infection in the last 1-2 weeks. If your test is negative now, you can still test positive later. So, if you do develop symptoms you should either get re-tested and/or isolate x 5 days and then strict mask use x 5 days (unvaccinated) or mask use x 10 days (vaccinated). Please follow CDC guidelines.  While Rapid antigen tests come back in 15-20 minutes, send out PCR/molecular test results typically come back within 1-3 days. In the mean time, if you are symptomatic, assume this could be a positive test and treat/monitor yourself as if you do have COVID.   We will call with test results if positive. Please download the MyChart app and set up a profile to access test results.   If symptomatic, go home and rest. Push fluids. Take  Tylenol as needed for discomfort. Gargle warm salt water. Throat lozenges. Take Mucinex DM or Robitussin for cough. Humidifier in bedroom to ease coughing. Warm showers. Also review the COVID handout for more information.  COVID-19 INFECTION: The incubation period of COVID-19 is approximately 14 days after exposure, with most symptoms  developing in roughly 4-5 days. Symptoms may range in severity from mild to critically severe. Roughly 80% of those infected will have mild symptoms. People of any age may become infected with COVID-19 and have the ability to transmit the virus. The most common symptoms include: fever, fatigue, cough, body aches, headaches, sore throat, nasal congestion, shortness of breath, nausea, vomiting, diarrhea, changes in smell and/or taste.    COURSE OF ILLNESS Some patients may begin with mild disease which can progress quickly into critical symptoms. If your symptoms are worsening please call ahead to the Emergency Department and proceed there for further treatment. Recovery time appears to be roughly 1-2 weeks for mild symptoms and 3-6 weeks for severe disease.   GO IMMEDIATELY TO ER FOR FEVER YOU ARE UNABLE TO GET DOWN WITH TYLENOL, BREATHING PROBLEMS, CHEST PAIN, FATIGUE, LETHARGY, INABILITY TO EAT OR DRINK, ETC  QUARANTINE AND ISOLATION: To help decrease the spread of COVID-19 please remain isolated if you have COVID infection or are highly suspected to have COVID infection. This means -stay home and isolate to one room in the home if you live with others. Do not share a bed or bathroom with others while ill, sanitize and wipe down all countertops and keep common areas clean and disinfected. Stay home for 5 days. If you have no symptoms or your symptoms are resolving after 5 days, you can leave your house. Continue to wear a mask around others for 5 additional days. If you have been in close contact (within 6 feet) of someone diagnosed with COVID 19, you are advised to quarantine in your home for 14 days as symptoms can develop anywhere from 2-14 days after exposure to the virus. If you develop symptoms, you  must isolate.  Most current guidelines for COVID after exposure -unvaccinated: isolate 5 days and strict mask use x 5 days. Test on day 5 is possible -vaccinated: wear mask x 10 days if symptoms do not  develop -You do not necessarily need to be tested for COVID if you have + exposure and  develop symptoms. Just isolate at home x10 days from symptom onset During this global pandemic, CDC advises to practice social distancing, try to stay at least 546ft away from others at all times. Wear a face covering. Wash and sanitize your hands regularly and avoid going anywhere that is not necessary.  KEEP IN MIND THAT THE COVID TEST IS NOT 100% ACCURATE AND YOU SHOULD STILL DO EVERYTHING TO PREVENT POTENTIAL SPREAD OF VIRUS TO OTHERS (WEAR MASK, WEAR GLOVES, WASH HANDS AND SANITIZE REGULARLY). IF INITIAL TEST IS NEGATIVE, THIS MAY NOT MEAN YOU ARE DEFINITELY NEGATIVE. MOST ACCURATE TESTING IS DONE 5-7 DAYS AFTER EXPOSURE.   It is not advised by CDC to get re-tested after receiving a positive COVID test since you can still test positive for weeks to months after you have already cleared the virus.   *If you have not been vaccinated for COVID, I strongly suggest you consider getting vaccinated as long as there are no contraindications.      ED Prescriptions    Medication Sig Dispense Auth. Provider   promethazine-dextromethorphan (PROMETHAZINE-DM) 6.25-15 MG/5ML syrup Take 5 mLs by mouth at  bedtime for 10 days. 50 mL Eusebio Friendly B, PA-C   amoxicillin (AMOXIL) 500 MG capsule Take 1 capsule (500 mg total) by mouth 2 (two) times daily for 10 days. 20 capsule Shirlee Latch, PA-C     PDMP not reviewed this encounter.   Shirlee Latch, PA-C 11/08/20 1302

## 2020-11-08 NOTE — ED Triage Notes (Signed)
Pt c/o cough, nasal congestion, sore throat, and fatigue. Started about 3 days ago. Denies fever.

## 2020-11-13 ENCOUNTER — Other Ambulatory Visit: Payer: Self-pay | Admitting: Physician Assistant

## 2020-11-13 DIAGNOSIS — Z30011 Encounter for initial prescription of contraceptive pills: Secondary | ICD-10-CM

## 2020-11-13 NOTE — Telephone Encounter (Signed)
Patient seen 07/2020, and give 3 months of OCs to last until RP due.  Will OK the one refill for patient and she will need to RTC for RP prior to further refills.

## 2020-12-03 ENCOUNTER — Ambulatory Visit
Admission: EM | Admit: 2020-12-03 | Discharge: 2020-12-03 | Disposition: A | Discharge status: Home / Self Care | Payer: Medicaid Other | Attending: Family Medicine | Admitting: Family Medicine

## 2020-12-03 ENCOUNTER — Ambulatory Visit: Payer: Self-pay

## 2020-12-03 ENCOUNTER — Other Ambulatory Visit: Payer: Self-pay

## 2020-12-03 DIAGNOSIS — H8110 Benign paroxysmal vertigo, unspecified ear: Secondary | ICD-10-CM | POA: Diagnosis not present

## 2020-12-03 LAB — PREGNANCY, URINE: Preg Test, Ur: NEGATIVE

## 2020-12-03 MED ORDER — MECLIZINE HCL 25 MG PO TABS: 25.0000 mg | ORAL_TABLET | Freq: Three times a day (TID) | ORAL | 0 refills | Status: DC | PRN

## 2020-12-03 NOTE — ED Triage Notes (Signed)
Patient states that she has been dizzy x 2 days, worse with positional changes. States that she has been nausea with this at all. States that she has not been taking any medications. States that she has had syncopal episodes in the past.

## 2020-12-03 NOTE — Discharge Instructions (Signed)
Rest. Fluids.  Medication as prescribed.  If you worsen, go to the ER.  Take care  Dr. Shanetta Nicolls 

## 2020-12-03 NOTE — ED Provider Notes (Signed)
[facility]    CSN: [id number] Arrival date & time: 12/03/20  1615      History   Chief Complaint Chief Complaint  Patient presents with  . Dizziness   HPI  25 year old female presents with dizziness.  Started yesterday.  She reports that she feels dizzy and lightheaded.  Feels off balance.  Worse with positional changes.  She reports associated nausea and vomiting.  She has had some syncope secondary to her symptoms.  Likely vasovagal.  No fever.  No relieving factors.  Past Medical History:  Diagnosis Date  . Anxiety   . Asthma   . Syncope     Patient Active Problem List   Diagnosis Date Noted  . Morbid obesity with BMI of 50.0-59.9, adult (HCC) 08/12/2018  . History of herpes genitalis 12/25/2017  . History of asthma 06/16/2017  . Hx of self-harm 06/16/2017  . Recurrent major depressive disorder, in full remission (HCC) 11/09/2014  . Asthma 11/23/2012    Past Surgical History:  Procedure Laterality Date  . CESAREAN SECTION      OB History   No obstetric history on file.      Home Medications    Prior to Admission medications   Medication Sig Start Date End Date Taking? Authorizing Provider  meclizine (ANTIVERT) 25 MG tablet Take 1 tablet (25 mg total) by mouth 3 (three) times daily as needed for dizziness. 12/03/20  Yes Tommie Sams, DO  SPRINTEC 28 0.25-35 MG-MCG tablet Take 1 tablet by mouth once daily 11/13/20  Yes Hampton, Carla J, PA  albuterol (VENTOLIN HFA) 108 (90 Base) MCG/ACT inhaler Inhale 2 puffs into the lungs every 4 (four) hours as needed for wheezing or shortness of breath. 08/08/20 12/03/20  Becky Augusta, NP  budesonide-formoterol (SYMBICORT) 80-4.5 MCG/ACT inhaler Inhale 2 puffs into the lungs 2 (two) times daily. Patient not taking: Reported on 07/13/2020 08/09/18 08/08/20  Tommie Sams, DO  norethindrone (JENCYCLA) 0.35 MG tablet Take 1 tablet by mouth daily.  05/23/19  [provider]    Family History Family History   Problem Relation Age of Onset  . Osteoarthritis Mother   . Depression Mother   . Healthy Father     Social History Social History   Tobacco Use  . Smoking status: Current Every Day Smoker    Packs/day: 0.50    Types: Cigarettes  . Smokeless tobacco: Never Used  Vaping Use  . Vaping Use: Never used  Substance Use Topics  . Alcohol use: Yes    Comment: occasionally  . Drug use: Not Currently    Comment: pt states she has a hx of drug addiciton; pt states she used any and everything.      Allergies   Patient has no known allergies.   Review of Systems Review of Systems  Gastrointestinal: Positive for nausea and vomiting.  Neurological: Positive for dizziness, syncope and light-headedness.   Physical Exam Triage Vital Signs ED Triage Vitals  Enc Vitals Group     BP 12/03/20 1639 129/81     Pulse Rate 12/03/20 1639 72     Resp 12/03/20 1639 18     Temp 12/03/20 1639 98.6 F (37 C)     Temp Source 12/03/20 1639 Oral     SpO2 12/03/20 1639 99 %     Weight 12/03/20 1638 300 lb (136.1 kg)     Height 12/03/20 1638 5\' 5"  (1.651 m)     Head Circumference --  Peak Flow --      Pain Score 12/03/20 1638 0     Pain Loc --      Pain Edu? --      Excl. in GC? --    Updated Vital Signs BP 129/81 (BP Location: Left Arm)   Pulse 72   Temp 98.6 F (37 C) (Oral)   Resp 18   Ht 5\' 5"  (1.651 m)   Wt 136.1 kg   SpO2 99%   BMI 49.92 kg/m   Visual Acuity Right Eye Distance:   Left Eye Distance:   Bilateral Distance:    Right Eye Near:   Left Eye Near:    Bilateral Near:     Physical Exam Vitals and nursing note reviewed.  Constitutional:      General: She is not in acute distress.    Appearance: She is obese.  HENT:     Head: Normocephalic and atraumatic.  Eyes:     General:        Right eye: No discharge.        Left eye: No discharge.     Conjunctiva/sclera: Conjunctivae normal.     Pupils: Pupils are equal, round, and reactive to light.   Cardiovascular:     Rate and Rhythm: Normal rate and regular rhythm.     Heart sounds: No murmur heard.   Pulmonary:     Effort: Pulmonary effort is normal.     Breath sounds: No wheezing, rhonchi or rales.  Neurological:     General: No focal deficit present.     Mental Status: She is alert.  Psychiatric:        Mood and Affect: Mood normal.        Behavior: Behavior normal.    UC Treatments / Results  Labs (all labs ordered are listed, but only abnormal results are displayed) Labs Reviewed  PREGNANCY, URINE    EKG   Radiology No results found.  Procedures Procedures (including critical care time)  Medications Ordered in UC Medications - No data to display  Initial Impression / Assessment and Plan / UC Course  I have reviewed the triage vital signs and the nursing notes.  Pertinent labs & imaging results that were available during my care of the patient were reviewed by me and considered in my medical decision making (see chart for details).    25 year old female presents with vertigo.  Treating with meclizine.  Push fluids.  Supportive care.  She is to go to the ER if she worsens.  I believe that her syncopal episodes were secondary to vasovagal syncope.  Final Clinical Impressions(s) / UC Diagnoses   Final diagnoses:  Benign paroxysmal positional vertigo, unspecified laterality     Discharge Instructions     Rest. Fluids.  Medication as prescribed.  If you worsen, go to the ER.  Take care  Dr. 22    ED Prescriptions    Medication Sig Dispense Auth. Provider   meclizine (ANTIVERT) 25 MG tablet Take 1 tablet (25 mg total) by mouth 3 (three) times daily as needed for dizziness. 30 tablet Adriana Simas, DO     PDMP not reviewed this encounter.   Tommie Sams, Tommie Sams 12/03/20 1941

## 2020-12-09 ENCOUNTER — Emergency Department
Admission: EM | Admit: 2020-12-09 | Discharge: 2020-12-09 | Disposition: A | Discharge status: Home / Self Care | Payer: Medicaid Other | Attending: Emergency Medicine | Admitting: Emergency Medicine

## 2020-12-09 ENCOUNTER — Other Ambulatory Visit: Payer: Self-pay

## 2020-12-09 ENCOUNTER — Emergency Department: Payer: Medicaid Other

## 2020-12-09 ENCOUNTER — Encounter: Payer: Self-pay | Admitting: Emergency Medicine

## 2020-12-09 DIAGNOSIS — X58XXXA Exposure to other specified factors, initial encounter: Secondary | ICD-10-CM | POA: Diagnosis not present

## 2020-12-09 DIAGNOSIS — R4020 Unspecified coma: Secondary | ICD-10-CM | POA: Diagnosis present

## 2020-12-09 DIAGNOSIS — F17210 Nicotine dependence, cigarettes, uncomplicated: Secondary | ICD-10-CM | POA: Insufficient documentation

## 2020-12-09 DIAGNOSIS — E876 Hypokalemia: Secondary | ICD-10-CM | POA: Diagnosis not present

## 2020-12-09 DIAGNOSIS — T402X1A Poisoning by other opioids, accidental (unintentional), initial encounter: Secondary | ICD-10-CM | POA: Diagnosis not present

## 2020-12-09 DIAGNOSIS — J45909 Unspecified asthma, uncomplicated: Secondary | ICD-10-CM | POA: Insufficient documentation

## 2020-12-09 DIAGNOSIS — Z7951 Long term (current) use of inhaled steroids: Secondary | ICD-10-CM | POA: Insufficient documentation

## 2020-12-09 DIAGNOSIS — T50901A Poisoning by unspecified drugs, medicaments and biological substances, accidental (unintentional), initial encounter: Secondary | ICD-10-CM

## 2020-12-09 DIAGNOSIS — F1910 Other psychoactive substance abuse, uncomplicated: Secondary | ICD-10-CM | POA: Diagnosis not present

## 2020-12-09 LAB — COMPREHENSIVE METABOLIC PANEL
ALT: 40 U/L (ref 0–44)
AST: 31 U/L (ref 15–41)
Albumin: 3.6 g/dL (ref 3.5–5.0)
Alkaline Phosphatase: 68 U/L (ref 38–126)
Anion gap: 13 (ref 5–15)
BUN: 12 mg/dL (ref 6–20)
CO2: 20 mmol/L — ABNORMAL LOW (ref 22–32)
Calcium: 8.5 mg/dL — ABNORMAL LOW (ref 8.9–10.3)
Chloride: 104 mmol/L (ref 98–111)
Creatinine, Ser: 1.22 mg/dL — ABNORMAL HIGH (ref 0.44–1.00)
GFR, Estimated: >60 mL/min (ref >60)
Glucose, Bld: 200 mg/dL — ABNORMAL HIGH (ref 70–99)
Potassium: 2.7 mmol/L — CL (ref 3.5–5.1)
Sodium: 137 mmol/L (ref 135–145)
Total Bilirubin: 0.2 mg/dL — ABNORMAL LOW (ref 0.3–1.2)
Total Protein: 7.8 g/dL (ref 6.5–8.1)

## 2020-12-09 LAB — CBC WITH DIFFERENTIAL/PLATELET
Abs Immature Granulocytes: 0.18 10*3/uL — ABNORMAL HIGH (ref 0.00–0.07)
Basophils Absolute: 0.1 10*3/uL (ref 0.0–0.1)
Basophils Relative: 1 %
Eosinophils Absolute: 0.3 10*3/uL (ref 0.0–0.5)
Eosinophils Relative: 2 %
HCT: 45.4 % (ref 36.0–46.0)
Hemoglobin: 15.3 g/dL — ABNORMAL HIGH (ref 12.0–15.0)
Immature Granulocytes: 1 %
Lymphocytes Relative: 45 %
Lymphs Abs: 7.9 10*3/uL — ABNORMAL HIGH (ref 0.7–4.0)
MCH: 29.1 pg (ref 26.0–34.0)
MCHC: 33.7 g/dL (ref 30.0–36.0)
MCV: 86.3 fL (ref 80.0–100.0)
Monocytes Absolute: 1.1 10*3/uL — ABNORMAL HIGH (ref 0.1–1.0)
Monocytes Relative: 6 %
Neutro Abs: 8 10*3/uL — ABNORMAL HIGH (ref 1.7–7.7)
Neutrophils Relative %: 45 %
Platelets: 366 10*3/uL (ref 150–400)
RBC: 5.26 MIL/uL — ABNORMAL HIGH (ref 3.87–5.11)
RDW: 13 % (ref 11.5–15.5)
Smear Review: NORMAL
WBC: 17.6 10*3/uL — ABNORMAL HIGH (ref 4.0–10.5)
nRBC: 0 % (ref 0.0–0.2)

## 2020-12-09 LAB — URINALYSIS, COMPLETE (UACMP) WITH MICROSCOPIC
Bilirubin Urine: NEGATIVE
Glucose, UA: NEGATIVE mg/dL
Ketones, ur: 5 mg/dL — AB
Leukocytes,Ua: NEGATIVE
Nitrite: NEGATIVE
Protein, ur: 30 mg/dL — AB
Specific Gravity, Urine: 1.025 (ref 1.005–1.030)
pH: 5 (ref 5.0–8.0)

## 2020-12-09 LAB — TROPONIN I (HIGH SENSITIVITY)
Troponin I (High Sensitivity): 7 ng/L (ref <18)
Troponin I (High Sensitivity): 30 ng/L — ABNORMAL HIGH (ref <18)
Troponin I (High Sensitivity): 17 ng/L (ref <18)

## 2020-12-09 LAB — URINE DRUG SCREEN, QUALITATIVE (ARMC ONLY)
Amphetamines, Ur Screen: NOT DETECTED
Barbiturates, Ur Screen: NOT DETECTED
Benzodiazepine, Ur Scrn: NOT DETECTED
Cannabinoid 50 Ng, Ur ~~LOC~~: NOT DETECTED
Cocaine Metabolite,Ur ~~LOC~~: NOT DETECTED
MDMA (Ecstasy)Ur Screen: NOT DETECTED
Methadone Scn, Ur: NOT DETECTED
Opiate, Ur Screen: NOT DETECTED
Phencyclidine (PCP) Ur S: NOT DETECTED
Tricyclic, Ur Screen: NOT DETECTED

## 2020-12-09 LAB — ETHANOL: Alcohol, Ethyl (B): 137 mg/dL — ABNORMAL HIGH (ref <10)

## 2020-12-09 LAB — BASIC METABOLIC PANEL
Anion gap: 6 (ref 5–15)
BUN: 10 mg/dL (ref 6–20)
CO2: 24 mmol/L (ref 22–32)
Calcium: 7.8 mg/dL — ABNORMAL LOW (ref 8.9–10.3)
Chloride: 108 mmol/L (ref 98–111)
Creatinine, Ser: 0.75 mg/dL (ref 0.44–1.00)
GFR, Estimated: >60 mL/min (ref >60)
Glucose, Bld: 98 mg/dL (ref 70–99)
Potassium: 4.5 mmol/L (ref 3.5–5.1)
Sodium: 138 mmol/L (ref 135–145)

## 2020-12-09 LAB — BRAIN NATRIURETIC PEPTIDE: B Natriuretic Peptide: 32.5 pg/mL (ref 0.0–100.0)

## 2020-12-09 LAB — SALICYLATE LEVEL: Salicylate Lvl: <7 mg/dL — ABNORMAL LOW (ref 7.0–30.0)

## 2020-12-09 LAB — ACETAMINOPHEN LEVEL: Acetaminophen (Tylenol), Serum: <10 ug/mL — ABNORMAL LOW (ref 10–30)

## 2020-12-09 LAB — HCG, QUANTITATIVE, PREGNANCY: hCG, Beta Chain, Quant, S: 1 m[IU]/mL (ref <5)

## 2020-12-09 LAB — MAGNESIUM: Magnesium: 2.5 mg/dL — ABNORMAL HIGH (ref 1.7–2.4)

## 2020-12-09 MED ORDER — SODIUM CHLORIDE 0.9 % IV BOLUS
1000.0000 mL | Freq: Once | INTRAVENOUS | Status: AC
  Administered 2020-12-09: 1000 mL via INTRAVENOUS

## 2020-12-09 MED ORDER — NALOXONE HCL 2 MG/2ML IJ SOSY
0.4000 mg | PREFILLED_SYRINGE | Freq: Once | INTRAMUSCULAR | Status: AC
  Administered 2020-12-09: 0.4 mg via INTRAVENOUS
  Filled 2020-12-09: qty 2

## 2020-12-09 MED ORDER — POTASSIUM CHLORIDE 10 MEQ/100ML IV SOLN
10.0000 meq | INTRAVENOUS | Status: AC
  Administered 2020-12-09 (×3): 10 meq via INTRAVENOUS
  Filled 2020-12-09 (×3): qty 100

## 2020-12-09 MED ORDER — ONDANSETRON HCL 4 MG/2ML IJ SOLN
4.0000 mg | Freq: Once | INTRAMUSCULAR | Status: AC
  Administered 2020-12-09: 4 mg via INTRAVENOUS
  Filled 2020-12-09: qty 2

## 2020-12-09 MED ORDER — NALOXONE HCL 4 MG/0.1ML NA LIQD: NASAL | 1 refills | Status: AC

## 2020-12-09 MED ORDER — NALOXONE HCL 2 MG/2ML IJ SOSY
0.4000 mg | PREFILLED_SYRINGE | Freq: Once | INTRAMUSCULAR | Status: AC
  Administered 2020-12-09: 0.4 mg via INTRAVENOUS

## 2020-12-09 MED ORDER — OXYCODONE HCL 5 MG PO TABS: 5.0000 mg | ORAL_TABLET | Freq: Once | ORAL | Status: DC

## 2020-12-09 MED ORDER — POTASSIUM CHLORIDE ER 10 MEQ PO TBCR: 20.0000 meq | EXTENDED_RELEASE_TABLET | Freq: Every day | ORAL | 0 refills | Status: DC

## 2020-12-09 MED ORDER — ONDANSETRON 4 MG PO TBDP: 4.0000 mg | ORAL_TABLET | Freq: Three times a day (TID) | ORAL | 0 refills | Status: DC | PRN

## 2020-12-09 MED ORDER — ONDANSETRON HCL 4 MG/2ML IJ SOLN
4.0000 mg | Freq: Once | INTRAMUSCULAR | Status: AC
  Administered 2020-12-09: 4 mg via INTRAVENOUS

## 2020-12-09 MED ORDER — METOCLOPRAMIDE HCL 5 MG/ML IJ SOLN
10.0000 mg | Freq: Once | INTRAMUSCULAR | Status: AC
  Administered 2020-12-09: 10 mg via INTRAVENOUS
  Filled 2020-12-09: qty 2

## 2020-12-09 MED ORDER — POTASSIUM CHLORIDE CRYS ER 20 MEQ PO TBCR
40.0000 meq | EXTENDED_RELEASE_TABLET | Freq: Once | ORAL | Status: AC
  Administered 2020-12-09: 40 meq via ORAL
  Filled 2020-12-09: qty 2

## 2020-12-09 NOTE — ED Notes (Signed)
Pt ambulated to restroom with a steady gait

## 2020-12-09 NOTE — ED Notes (Signed)
Lab at bedside

## 2020-12-09 NOTE — ED Triage Notes (Signed)
Pt to ED by POV from home.  [facility] called ED to inform that patient being brought in by POV by a female, pt unresponsive in car.  Pt placed on ED stretcher in lobby and brought immediately back to room 1.  Pt sitting up alert maintaining airway.  Dr. Fuller Plan at bedside to meet patient.  Pt RA saturation 79% and placed on 2L [place] with oxygen up to 100%.  Pt nauseous, given 4mg  zofran and .4mg  narcan.  Patient more awake and talking, oriented to being in hospital but unsure of what happened tonight.  States she drank "a ton" of alcohol and took 2 percocet together.  States she has done this before.  Denies SOB, pain, SI/HI.

## 2020-12-09 NOTE — ED Notes (Signed)
This RN called lab to see the status on the bloodwork. Lab explained that they were not able to obtain bloodwork but was unsuccessful and was sending another phlebotomist down.

## 2020-12-09 NOTE — ED Notes (Signed)
Dr. Siadecki at bedside for reevaluation.  

## 2020-12-09 NOTE — ED Provider Notes (Signed)
-----------------------------------------   12:39 PM on 12/09/2020 -----------------------------------------  I took over care on this patient from Dr. Fuller Plan.  The patient presented with alcohol intoxication, possible opiate use, and was pending some additional labs and reassessment to sobriety.  The repeat troponin was slightly elevated.  I suspect that this was due to the patient's episodes of hypoxia, and also this was obtained about 6 hours after the initial troponin which is longer than ideal for a serial troponin assessment.  A third troponin obtained 2 hours after the second has now gone down to a normal range.  Also, the potassium has normalized on repeat BMP.  Urinalysis shows some RBCs and WBCs but is not consistent with UTI.   On reassessment the patient is alert and oriented.  She is clinically sober at this time.  The vital signs have remained normal for the last several hours and she has had no further episodes of hypoxia.  At this time, the patient is stable for discharge home.  She feels comfortable going home.  There is no indication for further ED work-up or inpatient admission.  I counseled her on the results of the work-up.  Return precautions given, and she expresses understanding.   Dionne Bucy, MD 12/09/20 1242

## 2020-12-09 NOTE — ED Notes (Signed)
Pt was able to tolerate cup of water.

## 2020-12-09 NOTE — Discharge Instructions (Addendum)
Your potassium was very low and you should take this potassium pills for the next few days and follow-up for recheck next week with your primary care doctor.  Take Zofran for any additional nausea and you should have a Narcan prescription in case you overdose again

## 2020-12-09 NOTE — ED Notes (Signed)
Unable to sign d/c signature due to failed Topaz. Pt verbalized understanding at this time.

## 2020-12-09 NOTE — ED Provider Notes (Signed)
[facility] Emergency Department Provider Note  ____________________________________________   Event Date/Time   First MD Initiated Contact with Patient 12/09/20 0129     (approximate)  I have reviewed the triage vital signs and the nursing notes.   HISTORY  Chief Complaint Drug Overdose    HPI [name] is a 25 y.o. female with asthma who comes in for unresponsiveness.  Patient was with a friend and brought in after being unresponsive.  According to friend patient was blue.  Patient took 2 Percocet and drink a lot of alcohol.  Patient is currently alert and oriented x2 but does not know the year and cannot say how much she drank.  She states that she does not take the Percocet daily.  She is not sure what happened today.  Friend is now at bedside who states that they were in the car and she had to take any Percocet about an hour and a half prior to the incident and he noticed that she had stopped breathing and her lips had turned blue and that he was trying to do mouth-to-mouth and then ended up bring her to the ER due to concern for overdose.            Past Medical History:  Diagnosis Date  . Anxiety   . Asthma   . Syncope     Patient Active Problem List   Diagnosis Date Noted  . Morbid obesity with BMI of 50.0-59.9, adult (HCC) 08/12/2018  . History of herpes genitalis 12/25/2017  . History of asthma 06/16/2017  . Hx of self-harm 06/16/2017  . Recurrent major depressive disorder, in full remission (HCC) 11/09/2014  . Asthma 11/23/2012    Past Surgical History:  Procedure Laterality Date  . CESAREAN SECTION      Prior to Admission medications   Medication Sig Start Date End Date Taking? Authorizing Provider  meclizine (ANTIVERT) 25 MG tablet Take 1 tablet (25 mg total) by mouth 3 (three) times daily as needed for dizziness. 12/03/20   Tommie Sams, DO  SPRINTEC 28 0.25-35 MG-MCG tablet Take 1 tablet by mouth once daily 11/13/20    Matt Holmes, PA  albuterol (VENTOLIN HFA) 108 (90 Base) MCG/ACT inhaler Inhale 2 puffs into the lungs every 4 (four) hours as needed for wheezing or shortness of breath. 08/08/20 12/03/20  Becky Augusta, NP  budesonide-formoterol (SYMBICORT) 80-4.5 MCG/ACT inhaler Inhale 2 puffs into the lungs 2 (two) times daily. Patient not taking: Reported on 07/13/2020 08/09/18 08/08/20  Tommie Sams, DO  norethindrone (JENCYCLA) 0.35 MG tablet Take 1 tablet by mouth daily.  05/23/19  [provider]    Allergies Patient has no known allergies.  Family History  Problem Relation Age of Onset  . Osteoarthritis Mother   . Depression Mother   . Healthy Father     Social History Social History   Tobacco Use  . Smoking status: Current Every Day Smoker    Packs/day: 0.50    Types: Cigarettes  . Smokeless tobacco: Never Used  Vaping Use  . Vaping Use: Never used  Substance Use Topics  . Alcohol use: Yes    Comment: occasionally  . Drug use: Not Currently    Comment: pt states she has a hx of drug addiciton; pt states she used any and everything.       Review of Systems Unable to get full review of systems due to patient's altered mental status ____________________________________________   PHYSICAL EXAM:  VITAL SIGNS: ED Triage Vitals  Blood pressure 110/76, pulse 99, temperature 97.6 F (36.4 C), temperature source Oral, resp. rate 15, height 5\' 5"  (1.651 m), weight 136.1 kg, last menstrual period 12/07/2020, SpO2 100 %.  Constitutional: Alert and oriented x2. Appears under the influence  Eyes: Conjunctivae are normal. EOMI. Head: Atraumatic. Nose: No congestion/rhinnorhea. Mouth/Throat: Mucous membranes are moist.   Neck: No stridor. Trachea Midline. FROM Cardiovascular: tachycardiac , regular rhythm. Grossly normal heart sounds.  Good peripheral circulation. Respiratory: Normal respiratory effort.  No retractions. Lungs CTAB. Gastrointestinal: Soft and nontender. No  distention. No abdominal bruits.  Musculoskeletal: No lower extremity tenderness nor edema.  No joint effusions. Neurologic:  Appears intoxicated. No gross focal neurologic deficits are appreciated.  Skin:  Skin is warm, dry and intact. No rash noted. Psychiatric: Mood and affect are normal. Speech and behavior are normal. GU: Deferred   ____________________________________________   LABS (all labs ordered are listed, but only abnormal results are displayed)  Labs Reviewed  CBC WITH DIFFERENTIAL/PLATELET - Abnormal; Notable for the following components:      Result Value   WBC 17.6 (*)    RBC 5.26 (*)    Hemoglobin 15.3 (*)    Neutro Abs 8.0 (*)    Lymphs Abs 7.9 (*)    Monocytes Absolute 1.1 (*)    Abs Immature Granulocytes 0.18 (*)    All other components within normal limits  COMPREHENSIVE METABOLIC PANEL - Abnormal; Notable for the following components:   Potassium 2.7 (*)    CO2 20 (*)    Glucose, Bld 200 (*)    Creatinine, Ser 1.22 (*)    Calcium 8.5 (*)    Total Bilirubin 0.2 (*)    All other components within normal limits  ACETAMINOPHEN LEVEL - Abnormal; Notable for the following components:   Acetaminophen (Tylenol), Serum <10 (*)    All other components within normal limits  SALICYLATE LEVEL - Abnormal; Notable for the following components:   Salicylate Lvl <7.0 (*)    All other components within normal limits  ETHANOL - Abnormal; Notable for the following components:   Alcohol, Ethyl (B) 137 (*)    All other components within normal limits  MAGNESIUM - Abnormal; Notable for the following components:   Magnesium 2.5 (*)    All other components within normal limits  RESP PANEL BY RT-PCR (FLU A&B, COVID) ARPGX2  HCG, QUANTITATIVE, PREGNANCY  URINE DRUG SCREEN, QUALITATIVE ([facility] ONLY)  BRAIN NATRIURETIC PEPTIDE  URINALYSIS, COMPLETE (UACMP) WITH MICROSCOPIC  TROPONIN I (HIGH SENSITIVITY)  TROPONIN I (HIGH SENSITIVITY)    ____________________________________________   ED ECG REPORT I, 02/06/2021, the attending physician, personally viewed and interpreted this ECG.  Sinus tachycardia rate of 102, no ST elevation, no T wave inversions, normal intervals ____________________________________________  RADIOLOGY Concha Se, personally viewed and evaluated these images (plain radiographs) as part of my medical decision making, as well as reviewing the written report by the radiologist.  ED MD interpretation: No pneumonia  Official radiology report(s): DG Chest Portable 1 View  Result Date: 12/09/2020 CLINICAL DATA:  Shortness of breath EXAM: PORTABLE CHEST 1 VIEW COMPARISON:  08/08/2020 FINDINGS: The heart size and mediastinal contours are within normal limits. Both lungs are clear. The visualized skeletal structures are unremarkable. IMPRESSION: No active disease. Electronically Signed   By: 14/04/2020 M.D.   On: 12/09/2020 01:52    ____________________________________________   PROCEDURES  Procedure(s) performed (including Critical Care):  .1-3 Lead EKG Interpretation  Performed by: Concha Se, MD Authorized by: Concha Se, MD     Interpretation: normal     ECG rate:  90s    ECG rate assessment: normal     Rhythm: sinus rhythm     Ectopy: none     Conduction: normal   .Critical Care Performed by: Concha Se, MD Authorized by: Concha Se, MD   Critical care provider statement:    Critical care time (minutes):  35   Critical care was necessary to treat or prevent imminent or life-threatening deterioration of the following conditions:  Respiratory failure (Hypoxic requiring Narcan)   Critical care was time spent personally by me on the following activities:  Discussions with consultants, evaluation of patient's response to treatment, examination of patient, ordering and performing treatments and interventions, ordering and review of laboratory studies, ordering and review of  radiographic studies, pulse oximetry, re-evaluation of patient's condition, obtaining history from patient or surrogate and review of old charts     ____________________________________________   INITIAL IMPRESSION / ASSESSMENT AND PLAN / ED COURSE  Susan Swanson was evaluated in Emergency Department on 12/09/2020 for the symptoms described in the history of present illness. She was evaluated in the context of the global COVID-19 pandemic, which necessitated consideration that the patient might be at risk for infection with the SARS-CoV-2 virus that causes COVID-19. Institutional protocols and algorithms that pertain to the evaluation of patients at risk for COVID-19 are in a state of rapid change based on information released by regulatory bodies including the CDC and federal and state organizations. These policies and algorithms were followed during the patient's care in the ED.    Patient is a 25 year old who comes in with concerns for drug overdose.  Patient took Percocet as well as drink a lot of alcohol and was unresponsive with partner.  No evidence of trauma on examination to suggest intracranial hemorrhage.  Labs ordered to evaluate for Electra abnormalities, AKI, pregnancy.  Patient initially hypoxic to the 80s so patient was given 0.4 of Narcan.  Oxygen levels returned to normal.  With chest x-ray to evaluate for aspiration, pneumonia and COVID swab.  Less likely PE given the above history.  We will keep patient on the cardiac monitor and continue to closely monitor.  Patient another episode where saturations went down to 90% patient given additional 0.4 of Narcan.  Since then she had had some additional vomiting so give another 4 mg of Zofran and then 10 mg of Reglan.  Patient is white count is elevated at 17 but I suspect this is more likely reactive.  Patient is afebrile and otherwise well-appearing I have lower suspicion for infection.  Her potassium is low at 2.7 will give some  IV repletion 30 mEq in her kidney function is elevated at 1.22 so patient is getting a liter of fluid.  Her magnesium is normal  Her cardiac markers negative  Patient handoff to oncoming team pending repeat troponin, UA, PO challenge and most likely discharge home with prescription for potassium.  Patient has been in the ER and not require additional Narcan for multiple hours and has been off oxygen.  To note patient denied any SI and stated that this was an accidental overdose        ____________________________________________   FINAL CLINICAL IMPRESSION(S) / ED DIAGNOSES   Final diagnoses:  Polysubstance abuse (HCC)  Accidental drug overdose, initial encounter  Hypokalemia      MEDICATIONS GIVEN DURING THIS  VISIT:  Medications  potassium chloride 10 mEq in 100 mL IVPB (10 mEq Intravenous New Bag/Given 12/09/20 0614)  potassium chloride SA (KLOR-CON) CR tablet 40 mEq (has no administration in time range)  ondansetron (ZOFRAN) injection 4 mg (4 mg Intravenous Given 12/09/20 0128)  naloxone [facility](NARCAN) injection 0.4 mg (0.4 mg Intravenous Given 12/09/20 0128)  sodium chloride 0.9 % bolus 1,000 mL (1,000 mLs Intravenous New Bag/Given 12/09/20 0231)  naloxone [facility](NARCAN) injection 0.4 mg (0.4 mg Intravenous Given 12/09/20 0228)  ondansetron (ZOFRAN) injection 4 mg (4 mg Intravenous Given 12/09/20 0241)  metoCLOPramide (REGLAN) injection 10 mg (10 mg Intravenous Given 12/09/20 0357)     ED Discharge Orders         Ordered    potassium chloride (KLOR-CON) 10 MEQ tablet  Daily        12/09/20 0705    ondansetron (ZOFRAN ODT) 4 MG disintegrating tablet  Every 8 hours PRN        12/09/20 0705    naloxone (NARCAN) nasal spray 4 mg/0.1 mL        12/09/20 0705           Note:  This document was prepared using Dragon voice recognition software and may include unintentional dictation errors.   Concha SeFunke, Nagi Furio E, MD 12/09/20 [phone]

## 2020-12-09 NOTE — ED Notes (Signed)
Pt's IV catheter was no longer in the vein. IV removed. Attempted a straight stick for blood work. Attempt unsuccessful. Called lab to send someone down to obtain blood work.

## 2021-01-08 ENCOUNTER — Emergency Department
Admission: EM | Admit: 2021-01-08 | Discharge: 2021-01-09 | Disposition: A | Discharge status: Home / Self Care | Payer: Medicaid Other | Attending: Emergency Medicine | Admitting: Emergency Medicine

## 2021-01-08 ENCOUNTER — Other Ambulatory Visit: Payer: Self-pay

## 2021-01-08 DIAGNOSIS — F32A Depression, unspecified: Secondary | ICD-10-CM | POA: Diagnosis present

## 2021-01-08 DIAGNOSIS — F1994 Other psychoactive substance use, unspecified with psychoactive substance-induced mood disorder: Secondary | ICD-10-CM

## 2021-01-08 DIAGNOSIS — F17210 Nicotine dependence, cigarettes, uncomplicated: Secondary | ICD-10-CM | POA: Insufficient documentation

## 2021-01-08 DIAGNOSIS — J45909 Unspecified asthma, uncomplicated: Secondary | ICD-10-CM | POA: Insufficient documentation

## 2021-01-08 DIAGNOSIS — Y907 Blood alcohol level of 200-239 mg/100 ml: Secondary | ICD-10-CM | POA: Diagnosis not present

## 2021-01-08 DIAGNOSIS — T402X1A Poisoning by other opioids, accidental (unintentional), initial encounter: Secondary | ICD-10-CM | POA: Insufficient documentation

## 2021-01-08 DIAGNOSIS — F10929 Alcohol use, unspecified with intoxication, unspecified: Secondary | ICD-10-CM | POA: Diagnosis not present

## 2021-01-08 DIAGNOSIS — Z20822 Contact with and (suspected) exposure to covid-19: Secondary | ICD-10-CM | POA: Insufficient documentation

## 2021-01-08 DIAGNOSIS — F1924 Other psychoactive substance dependence with psychoactive substance-induced mood disorder: Secondary | ICD-10-CM | POA: Insufficient documentation

## 2021-01-08 DIAGNOSIS — T50904A Poisoning by unspecified drugs, medicaments and biological substances, undetermined, initial encounter: Secondary | ICD-10-CM

## 2021-01-08 LAB — URINE DRUG SCREEN, QUALITATIVE (ARMC ONLY)
Amphetamines, Ur Screen: NOT DETECTED
Barbiturates, Ur Screen: NOT DETECTED
Benzodiazepine, Ur Scrn: NOT DETECTED
Cannabinoid 50 Ng, Ur ~~LOC~~: NOT DETECTED
Cocaine Metabolite,Ur ~~LOC~~: NOT DETECTED
MDMA (Ecstasy)Ur Screen: NOT DETECTED
Methadone Scn, Ur: NOT DETECTED
Opiate, Ur Screen: NOT DETECTED
Phencyclidine (PCP) Ur S: NOT DETECTED
Tricyclic, Ur Screen: NOT DETECTED

## 2021-01-08 LAB — CBC
HCT: 44.4 % (ref 36.0–46.0)
Hemoglobin: 14.9 g/dL (ref 12.0–15.0)
MCH: 28.4 pg (ref 26.0–34.0)
MCHC: 33.6 g/dL (ref 30.0–36.0)
MCV: 84.6 fL (ref 80.0–100.0)
Platelets: 253 10*3/uL (ref 150–400)
RBC: 5.25 MIL/uL — ABNORMAL HIGH (ref 3.87–5.11)
RDW: 12.9 % (ref 11.5–15.5)
WBC: 9.2 10*3/uL (ref 4.0–10.5)
nRBC: 0 % (ref 0.0–0.2)

## 2021-01-08 LAB — COMPREHENSIVE METABOLIC PANEL
ALT: 32 U/L (ref 0–44)
AST: 31 U/L (ref 15–41)
Albumin: 3.6 g/dL (ref 3.5–5.0)
Alkaline Phosphatase: 69 U/L (ref 38–126)
Anion gap: 9 (ref 5–15)
BUN: 9 mg/dL (ref 6–20)
CO2: 25 mmol/L (ref 22–32)
Calcium: 8.5 mg/dL — ABNORMAL LOW (ref 8.9–10.3)
Chloride: 108 mmol/L (ref 98–111)
Creatinine, Ser: 0.89 mg/dL (ref 0.44–1.00)
GFR, Estimated: >60 mL/min (ref >60)
Glucose, Bld: 102 mg/dL — ABNORMAL HIGH (ref 70–99)
Potassium: 3.5 mmol/L (ref 3.5–5.1)
Sodium: 142 mmol/L (ref 135–145)
Total Bilirubin: 0.4 mg/dL (ref 0.3–1.2)
Total Protein: 7.3 g/dL (ref 6.5–8.1)

## 2021-01-08 LAB — CBG MONITORING, ED: Glucose-Capillary: 89 mg/dL (ref 70–99)

## 2021-01-08 LAB — ETHANOL: Alcohol, Ethyl (B): 303 mg/dL (ref <10)

## 2021-01-08 LAB — RESP PANEL BY RT-PCR (FLU A&B, COVID) ARPGX2
Influenza A by PCR: NEGATIVE
Influenza B by PCR: NEGATIVE
SARS Coronavirus 2 by RT PCR: NEGATIVE

## 2021-01-08 LAB — SALICYLATE LEVEL: Salicylate Lvl: <7 mg/dL — ABNORMAL LOW (ref 7.0–30.0)

## 2021-01-08 LAB — ACETAMINOPHEN LEVEL: Acetaminophen (Tylenol), Serum: <10 ug/mL — ABNORMAL LOW (ref 10–30)

## 2021-01-08 MED ORDER — HALOPERIDOL LACTATE 5 MG/ML IJ SOLN
5.0000 mg | Freq: Once | INTRAMUSCULAR | Status: AC
  Administered 2021-01-09: 5 mg via INTRAMUSCULAR

## 2021-01-08 MED ORDER — DIPHENHYDRAMINE HCL 50 MG/ML IJ SOLN
25.0000 mg | Freq: Once | INTRAMUSCULAR | Status: AC
  Administered 2021-01-09: 25 mg via INTRAMUSCULAR

## 2021-01-08 MED ORDER — LORAZEPAM 2 MG/ML IJ SOLN
2.0000 mg | Freq: Once | INTRAMUSCULAR | Status: AC
  Administered 2021-01-09: 2 mg via INTRAMUSCULAR
  Filled 2021-01-08: qty 1

## 2021-01-08 NOTE — ED Notes (Addendum)
Pt now in room  Security at doorside. meds on hold for now.

## 2021-01-08 NOTE — ED Provider Notes (Signed)
[facility] Emergency Department Provider Note  Time seen: 10:35 PM  I have reviewed the triage vital signs and the nursing notes.   HISTORY  Chief Complaint Drug Overdose   HPI Susan Swanson is a 25 y.o. female with a past medical history of anxiety, substance abuse, presents emergency department after an overdose.  According to the patient her grandmother passed away today.  Patient is very tearful states she is very depressed and was drinking alcohol.  States she snorted fentanyl because she wanted to "forget everything."  Patient admits to doing this intentionally because she is very depressed today but states she did not try to kill her self.  Patient is very tearful throughout examination admits to feeling very depressed.   Past Medical History:  Diagnosis Date  . Anxiety   . Asthma   . Syncope     Patient Active Problem List   Diagnosis Date Noted  . Morbid obesity with BMI of 50.0-59.9, adult (HCC) 08/12/2018  . History of herpes genitalis 12/25/2017  . History of asthma 06/16/2017  . Hx of self-harm 06/16/2017  . Recurrent major depressive disorder, in full remission (HCC) 11/09/2014  . Asthma 11/23/2012    Past Surgical History:  Procedure Laterality Date  . CESAREAN SECTION      Prior to Admission medications   Medication Sig Start Date End Date Taking? Authorizing Provider  acyclovir (ZOVIRAX) 800 MG tablet  07/13/20   [provider]  meclizine (ANTIVERT) 25 MG tablet Take 1 tablet (25 mg total) by mouth 3 (three) times daily as needed for dizziness. 12/03/20   Tommie Sams, DO  naloxone [facility]) nasal spray 4 mg/0.1 mL Take as needed for overdose 12/09/20   Concha Se, MD  ondansetron (ZOFRAN ODT) 4 MG disintegrating tablet Take 1 tablet (4 mg total) by mouth every 8 (eight) hours as needed for nausea or vomiting. 12/09/20   Concha Se, MD  potassium chloride (KLOR-CON) 10 MEQ tablet Take 2 tablets (20 mEq total) by mouth  daily for 4 days. 12/09/20 12/13/20  Concha Se, MD  SPRINTEC 28 0.25-35 MG-MCG tablet Take 1 tablet by mouth once daily 11/13/20   Matt Holmes, PA  albuterol (VENTOLIN HFA) 108 (90 Base) MCG/ACT inhaler Inhale 2 puffs into the lungs every 4 (four) hours as needed for wheezing or shortness of breath. 08/08/20 12/03/20  Becky Augusta, NP  budesonide-formoterol (SYMBICORT) 80-4.5 MCG/ACT inhaler Inhale 2 puffs into the lungs 2 (two) times daily. Patient not taking: Reported on 07/13/2020 08/09/18 08/08/20  Tommie Sams, DO  norethindrone (JENCYCLA) 0.35 MG tablet Take 1 tablet by mouth daily.  05/23/19  [provider]    Allergies  Allergen Reactions  . Doxycycline     Other reaction(s): dizzy    Family History  Problem Relation Age of Onset  . Osteoarthritis Mother   . Depression Mother   . Healthy Father     Social History Social History   Tobacco Use  . Smoking status: Current Every Day Smoker    Packs/day: 0.50    Types: Cigarettes  . Smokeless tobacco: Never Used  Vaping Use  . Vaping Use: Never used  Substance Use Topics  . Alcohol use: Yes  . Drug use: Yes    Types: Marijuana    Comment: pt states she has a hx of drug addiciton; pt states she used any and everything.     Review of Systems Constitutional: Negative for fever. Cardiovascular:  Negative for chest pain. Respiratory: Negative for shortness of breath. Gastrointestinal: Negative for abdominal pain Musculoskeletal: Negative for musculoskeletal complaints Neurological: Negative for headache All other ROS negative  ____________________________________________   PHYSICAL EXAM:  VITAL SIGNS: ED Triage Vitals  Enc Vitals Group     BP 01/08/21 2217 106/79     Pulse Rate 01/08/21 2217 76     Resp 01/08/21 2217 14     Temp 01/08/21 2217 98.3 F (36.8 C)     Temp Source 01/08/21 2217 Oral     SpO2 01/08/21 2217 98 %     Weight 01/08/21 2216 299 lb 13.2 oz (136 kg)     Height 01/08/21 2216 5'  5" (1.651 m)     Head Circumference --      Peak Flow --      Pain Score 01/08/21 2215 0     Pain Loc --      Pain Edu? --      Excl. in GC? --    Constitutional: Alert and oriented.  Tearful Eyes: Normal exam ENT      Head: Normocephalic and atraumatic.      Mouth/Throat: Mucous membranes are moist. Cardiovascular: Normal rate, regular rhythm. No murmur Respiratory: Normal respiratory effort without tachypnea nor retractions. Breath sounds are clear  Gastrointestinal: Soft and nontender. No distention.  Musculoskeletal: Nontender with normal range of motion in all extremities. Neurologic:  Normal speech and language. No gross focal neurologic deficits  Skin:  Skin is warm, dry and intact.  Psychiatric: Tearful admits depression  ____________________________________________    EKG  EKG viewed and interpreted by myself shows a normal sinus rhythm at 95 bpm with a narrow QRS, normal axis, normal intervals, no concerning ST changes.  ____________________________________________   INITIAL IMPRESSION / ASSESSMENT AND PLAN / ED COURSE  Pertinent labs & imaging results that were available during my care of the patient were reviewed by me and considered in my medical decision making (see chart for details).   Patient presents emergency department after overdosing on fentanyl.  Patient admits to drinking and doing fentanyl today because she is very depressed because her grandmother died.  States she did not want to remember anything and just wants to forget.  Patient denies try to kill her self.  Patient was found by her boyfriend and received intranasal Narcan.  Upon arrival patient very tearful admits significant depression.  Given the depression with recent death in the family and overdose which appears to be possibly intentional we will place the patient under IVC until psychiatry can evaluate.  Patient is awake and alert we will continue to closely monitor and check labs.  Susan Swanson was evaluated in Emergency Department on 01/08/2021 for the symptoms described in the history of present illness. She was evaluated in the context of the global COVID-19 pandemic, which necessitated consideration that the patient might be at risk for infection with the SARS-CoV-2 virus that causes COVID-19. Institutional protocols and algorithms that pertain to the evaluation of patients at risk for COVID-19 are in a state of rapid change based on information released by regulatory bodies including the CDC and federal and state organizations. These policies and algorithms were followed during the patient's care in the ED.  ____________________________________________   FINAL CLINICAL IMPRESSION(S) / ED DIAGNOSES  Opioid overdose Depression   Minna Antis, MD 01/08/21 2239

## 2021-01-08 NOTE — ED Notes (Signed)
PT refusing to be placed on the monitor, EDP made aware.

## 2021-01-08 NOTE — ED Notes (Addendum)
Pt undressed completely into hospital gown and blue scrub pants until medically cleared.  Pts belongings, which include Camo stretch pants, tshirt, purple bra, hair tie, bagged, labelled and placed at nurses station.  Pt has no jewelry, cell phone, socks or shoes, purse or wallet.

## 2021-01-08 NOTE — ED Notes (Signed)
IVC papers sent to magistrate and magistrate called to inform

## 2021-01-08 NOTE — ED Notes (Signed)
Pt up to bathroom and then back to bed. Pt in hallway asking for another blanket. Engineer, materials observed by TEFL teacher request and pt sts, "Just leave me alone." Writer in room with pt and pt requesting for officer to not come near her and sts, "Fuke you, fuck you," to Clinical research associate. Writer out of room and pt in bed crying while lying in bed.

## 2021-01-08 NOTE — ED Notes (Signed)
Pt up in the hallway, requesting her mother, security with pt.   Pt remains tearful.  Pt difficult to redirect to room.

## 2021-01-08 NOTE — ED Triage Notes (Addendum)
Pt presents to ER via ems after drug OD on fentanyl tonight. Pt's boyfriend found her unresponsive and gave 1 mg IN narcan. pt drank 1.5 bottles of brown liquor.  Pt states she did drugs and drank tonight d/t her grandmother dying tonight.  Pt obviously intoxicated at this time.  Pt denies pain at this time.  Pt denies direct SI, but states "I drank and did drugs to forget everything today."

## 2021-01-09 DIAGNOSIS — F1994 Other psychoactive substance use, unspecified with psychoactive substance-induced mood disorder: Secondary | ICD-10-CM

## 2021-01-09 DIAGNOSIS — F10929 Alcohol use, unspecified with intoxication, unspecified: Secondary | ICD-10-CM

## 2021-01-09 MED ORDER — IBUPROFEN 800 MG PO TABS
800.0000 mg | ORAL_TABLET | Freq: Four times a day (QID) | ORAL | Status: DC | PRN
  Administered 2021-01-09: 800 mg via ORAL
  Filled 2021-01-09: qty 1

## 2021-01-09 MED ORDER — HALOPERIDOL LACTATE 5 MG/ML IJ SOLN
INTRAMUSCULAR | Status: AC
  Filled 2021-01-09: qty 1

## 2021-01-09 MED ORDER — DIPHENHYDRAMINE HCL 50 MG/ML IJ SOLN
INTRAMUSCULAR | Status: AC
  Filled 2021-01-09: qty 1

## 2021-01-09 NOTE — ED Notes (Signed)
Pt calling ride.

## 2021-01-09 NOTE — ED Notes (Signed)
Given  breakfast

## 2021-01-09 NOTE — ED Notes (Signed)
Pt asleep. Vs will be assess when pt is awake.  

## 2021-01-09 NOTE — ED Notes (Signed)
Pt out in hallway, security guard engaging while Clinical research associate observed. Pt requesting for mother, Oceanographer pt that she will have to remain in room and that her mother is at home and can come in the morning when visiting hours began. Pt also educated on her commitment paperwork and what the meaning of those are. Pt refusing to go back to room and starts to Pensions consultant. Officer attempting to move pt back into room but pt continuing to Airline pilot. Writer helping to block and verbally deescalate pt. Pt continuing to attempt to move past staff. Pt grabbing at walls. Charge Rn and other security officers to hallway to assist pt back into room. Pt agrees to go back into room.

## 2021-01-09 NOTE — BH Assessment (Addendum)
Patient unable to awaken and participate in assessment due to being medicated. TTS to monitor/follow when pt awakens.

## 2021-01-09 NOTE — Consult Note (Signed)
[facility] Face-to-Face Psychiatry Consult   Reason for Consult: Consult for this 25 year old woman came to the emergency room last night with reports of overdose Referring Physician: Katrinka Blazing Patient Identification: Susan Swanson MRN:  [id number] Principal Diagnosis: Substance induced mood disorder (HCC) Diagnosis:  Principal Problem:   Substance induced mood disorder (HCC) Active Problems:   Alcohol intoxication (HCC)   Total Time spent with patient: 1 hour  Subjective:   Susan Swanson is a 25 y.o. female patient admitted with "I was drinking".  HPI: Patient seen chart reviewed.  25 year old woman came to the emergency room last night with altered mental status.  Reports of possible overdose.  Documented that the patient told staff that she had been snorting fentanyl.  She was intoxicated with an elevated alcohol level.  Patient had denied having any suicidal intent last night.  On reevaluation today she says she has only vague memories of coming to the hospital.  She now insists that she has no memory of snorting drugs and does not believe she had taken any fentanyl.  She continues to deny any suicidal thought intent or plan.  Admits that she was drinking yesterday because her grandmother passed away.  Denies ongoing daily alcohol use.  Denies having been particularly depressed.  Denies psychotic symptoms.  Currently complaining of a headache.  Behavior right now calm and lucid.  Past Psychiatric History: Patient reports she had a past history of occasional use of narcotics but does not do it anymore.  Not receiving any current mental health treatment.  Past diagnosis of depression and used to take Prozac.  Says she is not currently on any medicine.  Denies any history of suicide attempts mania or violence.  Risk to Self:   Risk to Others:   Prior Inpatient Therapy:   Prior Outpatient Therapy:    Past Medical History:  Past Medical History:  Diagnosis Date  . Anxiety   . Asthma   .  Syncope     Past Surgical History:  Procedure Laterality Date  . CESAREAN SECTION     Family History:  Family History  Problem Relation Age of Onset  . Osteoarthritis Mother   . Depression Mother   . Healthy Father    Family Psychiatric  History: Denies any Social History:  Social History   Substance and Sexual Activity  Alcohol Use Yes     Social History   Substance and Sexual Activity  Drug Use Yes  . Types: Marijuana   Comment: pt states she has a hx of drug addiciton; pt states she used any and everything.     Social History   Socioeconomic History  . Marital status: Single    Spouse name: Not on file  . Number of children: 1  . Years of education: Not on file  . Highest education level: 12th grade  Occupational History  . Not on file  Tobacco Use  . Smoking status: Current Every Day Smoker    Packs/day: 0.50    Types: Cigarettes  . Smokeless tobacco: Never Used  Vaping Use  . Vaping Use: Never used  Substance and Sexual Activity  . Alcohol use: Yes  . Drug use: Yes    Types: Marijuana    Comment: pt states she has a hx of drug addiciton; pt states she used any and everything.   Marland Kitchen Sexual activity: Yes    Birth control/protection: None, Pill  Other Topics Concern  . Not on file  Social History Narrative  .  Not on file   Social Determinants of Health   Financial Resource Strain: Not on file  Food Insecurity: Not on file  Transportation Needs: Not on file  Physical Activity: Not on file  Stress: Not on file  Social Connections: Not on file   Additional Social History:    Allergies:   Allergies  Allergen Reactions  . Doxycycline     Other reaction(s): dizzy    Labs:  Results for orders placed or performed during the hospital encounter of 01/08/21 (from the past 48 hour(s))  CBG monitoring, ED     Status: None   Collection Time: 01/08/21 10:12 PM  Result Value Ref Range   Glucose-Capillary 89 70 - 99 mg/dL    Comment: Glucose reference  range applies only to samples taken after fasting for at least 8 hours.   Comment 1 Document in Chart   CBC     Status: Abnormal   Collection Time: 01/08/21 10:29 PM  Result Value Ref Range   WBC 9.2 4.0 - 10.5 K/uL   RBC 5.25 (H) 3.87 - 5.11 MIL/uL   Hemoglobin 14.9 12.0 - 15.0 g/dL   HCT 16.1 09.6 - 04.5 %   MCV 84.6 80.0 - 100.0 fL   MCH 28.4 26.0 - 34.0 pg   MCHC 33.6 30.0 - 36.0 g/dL   RDW 40.9 81.1 - 91.4 %   Platelets 253 150 - 400 K/uL   nRBC 0.0 0.0 - 0.2 %    Comment: Performed at [facility], [address], [place], [place] [address]  Comprehensive metabolic panel     Status: Abnormal   Collection Time: 01/08/21 10:29 PM  Result Value Ref Range   Sodium 142 135 - 145 mmol/L   Potassium 3.5 3.5 - 5.1 mmol/L   Chloride 108 98 - 111 mmol/L   CO2 25 22 - 32 mmol/L   Glucose, Bld 102 (H) 70 - 99 mg/dL    Comment: Glucose reference range applies only to samples taken after fasting for at least 8 hours.   BUN 9 6 - 20 mg/dL   Creatinine, Ser 6.21 0.44 - 1.00 mg/dL   Calcium 8.5 (L) 8.9 - 10.3 mg/dL   Total Protein 7.3 6.5 - 8.1 g/dL   Albumin 3.6 3.5 - 5.0 g/dL   AST 31 15 - 41 U/L   ALT 32 0 - 44 U/L   Alkaline Phosphatase 69 38 - 126 U/L   Total Bilirubin 0.4 0.3 - 1.2 mg/dL   GFR, Estimated >30 >86 mL/min    Comment: (NOTE) Calculated using the CKD-EPI Creatinine Equation (2021)    Anion gap 9 5 - 15    Comment: Performed at [facility], [address] [place], [place] [address]  Ethanol     Status: Abnormal   Collection Time: 01/08/21 10:29 PM  Result Value Ref Range   Alcohol, Ethyl (B) 303 (HH) <10 mg/dL    Comment: CRITICAL RESULT CALLED TO, READ BACK BY AND VERIFIED WITH AMY COYNE @2346  01/08/21 RH (NOTE) Lowest detectable limit for serum alcohol is 10 mg/dL.  For medical purposes only. Performed at [facility], [address] [place], [place]   Salicylate level     Status: Abnormal   Collection Time:  01/08/21 10:29 PM  Result Value Ref Range   Salicylate Lvl <7.0 (L) 7.0 - 30.0 mg/dL    Comment: Performed at [facility], [address] [place], [place]  Acetaminophen level  Status: Abnormal   Collection Time: 01/08/21 10:29 PM  Result Value Ref Range   Acetaminophen (Tylenol), Serum <10 (L) 10 - 30 ug/mL    Comment: (NOTE) Therapeutic concentrations vary significantly. A range of 10-30 ug/mL  may be an effective concentration for many patients. However, some  are best treated at concentrations outside of this range. Acetaminophen concentrations >150 ug/mL at 4 hours after ingestion  and >50 ug/mL at 12 hours after ingestion are often associated with  toxic reactions.  Performed at [facility], [address], [place], [place] [address]   Urine Drug Screen, Qualitative [facility] only)     Status: None   Collection Time: 01/08/21 10:36 PM  Result Value Ref Range   Tricyclic, Ur Screen NONE DETECTED NONE DETECTED   Amphetamines, Ur Screen NONE DETECTED NONE DETECTED   MDMA (Ecstasy)Ur Screen NONE DETECTED NONE DETECTED   Cocaine Metabolite,Ur [place] NONE DETECTED NONE DETECTED   Opiate, Ur Screen NONE DETECTED NONE DETECTED   Phencyclidine (PCP) Ur S NONE DETECTED NONE DETECTED   Cannabinoid 50 Ng, Ur [place] NONE DETECTED NONE DETECTED   Barbiturates, Ur Screen NONE DETECTED NONE DETECTED   Benzodiazepine, Ur Scrn NONE DETECTED NONE DETECTED   Methadone Scn, Ur NONE DETECTED NONE DETECTED    Comment: (NOTE) Tricyclics + metabolites, urine    Cutoff 1000 ng/mL Amphetamines + metabolites, urine  Cutoff 1000 ng/mL MDMA (Ecstasy), urine              Cutoff 500 ng/mL Cocaine Metabolite, urine          Cutoff 300 ng/mL Opiate + metabolites, urine        Cutoff 300 ng/mL Phencyclidine (PCP), urine         Cutoff 25 ng/mL Cannabinoid, urine                 Cutoff 50 ng/mL Barbiturates + metabolites, urine  Cutoff 200 ng/mL Benzodiazepine, urine               Cutoff 200 ng/mL Methadone, urine                   Cutoff 300 ng/mL  The urine drug screen provides only a preliminary, unconfirmed analytical test result and should not be used for non-medical purposes. Clinical consideration and professional judgment should be applied to any positive drug screen result due to possible interfering substances. A more specific alternate chemical method must be used in order to obtain a confirmed analytical result. Gas chromatography / mass spectrometry (GC/MS) is the preferred confirm atory method. Performed at [facility], [address] [place], [place] [address]   Resp Panel by RT-PCR (Flu A&B, Covid) Nasopharyngeal Swab     Status: None   Collection Time: 01/08/21 10:52 PM   Specimen: Nasopharyngeal Swab; Nasopharyngeal(NP) swabs in vial transport medium  Result Value Ref Range   SARS Coronavirus 2 by RT PCR NEGATIVE NEGATIVE    Comment: (NOTE) SARS-CoV-2 target nucleic acids are NOT DETECTED.  The SARS-CoV-2 RNA is generally detectable in upper respiratory specimens during the acute phase of infection. The lowest concentration of SARS-CoV-2 viral copies this assay can detect is 138 copies/mL. A negative result does not preclude SARS-Cov-2 infection and should not be used as the sole basis for treatment or other patient management decisions. A negative result may occur with  improper specimen collection/handling, submission of specimen other than nasopharyngeal swab, presence of viral mutation(s) within the areas targeted by this assay, and  inadequate number of viral copies(<138 copies/mL). A negative result must be combined with clinical observations, patient history, and epidemiological information. The expected result is Negative.  Fact Sheet for Patients:  BloggerCourse.comhttps://www.fda.gov/media/152166/download  Fact Sheet for Healthcare Providers:  SeriousBroker.ithttps://www.fda.gov/media/152162/download  This test is no t yet approved or cleared by  the [place] FDA and  has been authorized for detection and/or diagnosis of SARS-CoV-2 by FDA under an Emergency Use Authorization (EUA). This EUA will remain  in effect (meaning this test can be used) for the duration of the COVID-19 declaration under Section 564(b)(1) of the Act, 21 U.S.C.section 360bbb-3(b)(1), unless the authorization is terminated  or revoked sooner.       Influenza A by PCR NEGATIVE NEGATIVE   Influenza B by PCR NEGATIVE NEGATIVE    Comment: (NOTE) The Xpert Xpress SARS-CoV-2/FLU/RSV plus assay is intended as an aid in the diagnosis of influenza from Nasopharyngeal swab specimens and should not be used as a sole basis for treatment. Nasal washings and aspirates are unacceptable for Xpert Xpress SARS-CoV-2/FLU/RSV testing.  Fact Sheet for Patients: BloggerCourse.comhttps://www.fda.gov/media/152166/download  Fact Sheet for Healthcare Providers: SeriousBroker.ithttps://www.fda.gov/media/152162/download  This test is not yet approved or cleared by the [place] FDA and has been authorized for detection and/or diagnosis of SARS-CoV-2 by FDA under an Emergency Use Authorization (EUA). This EUA will remain in effect (meaning this test can be used) for the duration of the COVID-19 declaration under Section 564(b)(1) of the Act, 21 U.S.C. section 360bbb-3(b)(1), unless the authorization is terminated or revoked.  Performed at [facility], [address] [place], [place] [address]     Current Facility-Administered Medications  Medication Dose Route Frequency Provider Last Rate Last Admin  . ibuprofen (ADVIL) tablet 800 mg  800 mg Oral Q6H PRN Emori Mumme, Jackquline DenmarkJohn T, MD       Current Outpatient Medications  Medication Sig Dispense Refill  . meclizine (ANTIVERT) 25 MG tablet Take 1 tablet (25 mg total) by mouth 3 (three) times daily as needed for dizziness. 30 tablet 0  . naloxone (NARCAN) nasal spray 4 mg/0.1 mL Take as needed for overdose 2 each 1  . ondansetron (ZOFRAN ODT) 4  MG disintegrating tablet Take 1 tablet (4 mg total) by mouth every 8 (eight) hours as needed for nausea or vomiting. 20 tablet 0  . potassium chloride (KLOR-CON) 10 MEQ tablet Take 2 tablets (20 mEq total) by mouth daily for 4 days. 8 tablet 0  . SPRINTEC 28 0.25-35 MG-MCG tablet Take 1 tablet by mouth once daily 28 tablet 0  . acyclovir (ZOVIRAX) 800 MG tablet  (Patient not taking: Reported on 01/08/2021)      Musculoskeletal: Strength & Muscle Tone: within normal limits Gait & Station: normal Patient leans: N/A            Psychiatric Specialty Exam:  Presentation  General Appearance: No data recorded Eye Contact:No data recorded Speech:No data recorded Speech Volume:No data recorded Handedness:No data recorded  Mood and Affect  Mood:No data recorded Affect:No data recorded  Thought Process  Thought Processes:No data recorded Descriptions of Associations:No data recorded Orientation:No data recorded Thought Content:No data recorded History of Schizophrenia/Schizoaffective disorder:No data recorded Duration of Psychotic Symptoms:No data recorded Hallucinations:No data recorded Ideas of Reference:No data recorded Suicidal Thoughts:No data recorded Homicidal Thoughts:No data recorded  Sensorium  Memory:No data recorded Judgment:No data recorded Insight:No data recorded  Executive Functions  Concentration:No data recorded Attention Span:No data recorded Recall:No data recorded Fund of Knowledge:No data recorded Language:No data recorded  Psychomotor Activity  Psychomotor Activity:No data recorded  Assets  Assets:No data recorded  Sleep  Sleep:No data recorded  Physical Exam: Physical Exam Vitals and nursing note reviewed.  Constitutional:      Appearance: Normal appearance.  HENT:     Head: Normocephalic and atraumatic.     Mouth/Throat:     Pharynx: Oropharynx is clear.  Eyes:     Pupils: Pupils are equal, round, and reactive to light.   Cardiovascular:     Rate and Rhythm: Normal rate and regular rhythm.  Pulmonary:     Effort: Pulmonary effort is normal.     Breath sounds: Normal breath sounds.  Abdominal:     General: Abdomen is flat.     Palpations: Abdomen is soft.  Musculoskeletal:        General: Normal range of motion.  Skin:    General: Skin is warm and dry.  Neurological:     General: No focal deficit present.     Mental Status: She is alert. Mental status is at baseline.  Psychiatric:        Attention and Perception: She is inattentive.        Mood and Affect: Mood normal.        Speech: Speech is delayed.        Behavior: Behavior is slowed.        Thought Content: Thought content normal.        Cognition and Memory: Memory is impaired.        Judgment: Judgment normal.    Review of Systems  Constitutional: Negative.   HENT: Negative.   Eyes: Negative.   Respiratory: Negative.   Cardiovascular: Negative.   Gastrointestinal: Negative.   Musculoskeletal: Negative.   Skin: Negative.   Neurological: Negative.   Psychiatric/Behavioral: Positive for memory loss and substance abuse. Negative for depression, hallucinations and suicidal ideas. The patient is nervous/anxious. The patient does not have insomnia.    Blood pressure 112/77, pulse 96, temperature 99.5 F (37.5 C), temperature source Oral, resp. rate 18, height 5\' 5"  (1.651 m), weight 136 kg, SpO2 97 %. Body mass index is 49.89 kg/m.  Treatment Plan Summary: Plan 25 year old woman presented intoxicated.  Drug screen negative for opiates but that would be expected if she had been using fentanyl.  Patient now insists that she does not think she had taken any drugs.  In any case she denies ever having had any suicidal intent or plan.  Admits she was upset yesterday because her grandmother died.  Currently patient presents with a somewhat blunted affect but is engaged in treatment.  Has positive plans for the future.  Does not appear to be  hopeless.  No evidence of psychosis.  Patient counseled about the dangers of opiate use and says she is well aware of them and stays completely away from using any narcotics.  Also states that she does not think she has an alcohol problem because last night was an anomaly.  She will be given the information for referral to outpatient substance abuse services if she chooses but no longer meets commitment criteria.  Discontinued IVC.  Case reviewed with emergency room physician.  Disposition: No evidence of imminent risk to self or others at present.   Patient does not meet criteria for psychiatric inpatient admission. Supportive therapy provided about ongoing stressors. Discussed crisis plan, support from social network, calling 911, coming to the Emergency Department, and calling Suicide Hotline.  22, MD 01/09/2021 10:58 AM

## 2021-01-09 NOTE — ED Notes (Signed)
TTS at bedside. 

## 2021-01-09 NOTE — ED Notes (Signed)
Pt sleeping. 

## 2021-01-09 NOTE — ED Notes (Signed)
Dr.Clapacs at bedside  

## 2021-01-09 NOTE — ED Provider Notes (Signed)
Emergency Medicine Observation Re-evaluation Note  Susan Swanson is a 25 y.o. female, seen on rounds today.  Pt initially presented to the ED for complaints of Drug Overdose Currently, the patient is resting, voices no medical complaint.  Physical Exam  BP 106/79 (BP Location: Right Arm)   Pulse 76   Temp 98.3 F (36.8 C) (Oral)   Resp 14   Ht 5\' 5"  (1.651 m)   Wt 136 kg   SpO2 98%   BMI 49.89 kg/m  Physical Exam General: Resting in no acute distress Cardiac: No cyanosis Lungs: Equal rise and fall Psych: Not agitated  ED Course / MDM  EKG:   I have reviewed the labs performed to date as well as medications administered while in observation.  Recent changes in the last 24 hours include patient received IM calming agents for agitation which was not verbally redirectable.  She received this successfully and has been resting since.  Plan  Current plan is for psychiatric disposition. Patient is under full IVC at this time.   , MD 01/09/21 [phone]

## 2021-01-09 NOTE — ED Notes (Signed)
Pt sitting in floor after injection , banging head on wall.  Pt then got up and tried to come out of room again.  Security assisted pt to bed.  Pt drowsy.  Pt lying in bed and given another blanket per her request.

## 2021-01-09 NOTE — ED Notes (Signed)
meds given  Pt tearful.  Pt in room   Security at doorside.

## 2021-01-09 NOTE — ED Notes (Signed)
Report to Jessica, RN

## 2021-01-09 NOTE — BH Assessment (Signed)
Writer provided the patient with information and instructions on how to access Outpatient Mental Health & Substance Abuse Treatment ([facility] and [facility]).  Patient denies SI/HI and AV/H.  ___________________ [facility] [address],  [place], [place] [address] [phone]  [facility] [address],  [place], [place] [address] [phone] 

## 2021-01-09 NOTE — ED Notes (Signed)
IVC/Unable to awaken due to medication

## 2021-02-13 ENCOUNTER — Ambulatory Visit
Admission: RE | Admit: 2021-02-13 | Discharge: 2021-02-13 | Disposition: A | Discharge status: Home / Self Care | Payer: Medicaid Other | Source: Ambulatory Visit | Attending: Family Medicine | Admitting: Family Medicine

## 2021-02-13 ENCOUNTER — Ambulatory Visit: Payer: Self-pay

## 2021-02-13 ENCOUNTER — Other Ambulatory Visit: Payer: Self-pay

## 2021-02-13 VITALS — BP 102/71 | HR 108 | Temp 98.7°F | Resp 20 | Ht 65.0 in | Wt 299.8 lb

## 2021-02-13 DIAGNOSIS — Z28310 Unvaccinated for covid-19: Secondary | ICD-10-CM | POA: Diagnosis not present

## 2021-02-13 DIAGNOSIS — Z20822 Contact with and (suspected) exposure to covid-19: Secondary | ICD-10-CM | POA: Diagnosis not present

## 2021-02-13 DIAGNOSIS — Z6841 Body Mass Index (BMI) 40.0 and over, adult: Secondary | ICD-10-CM | POA: Insufficient documentation

## 2021-02-13 DIAGNOSIS — Z79899 Other long term (current) drug therapy: Secondary | ICD-10-CM | POA: Insufficient documentation

## 2021-02-13 DIAGNOSIS — J029 Acute pharyngitis, unspecified: Secondary | ICD-10-CM | POA: Diagnosis not present

## 2021-02-13 DIAGNOSIS — F17210 Nicotine dependence, cigarettes, uncomplicated: Secondary | ICD-10-CM | POA: Insufficient documentation

## 2021-02-13 DIAGNOSIS — B349 Viral infection, unspecified: Secondary | ICD-10-CM | POA: Diagnosis present

## 2021-02-13 DIAGNOSIS — F1290 Cannabis use, unspecified, uncomplicated: Secondary | ICD-10-CM | POA: Insufficient documentation

## 2021-02-13 DIAGNOSIS — R059 Cough, unspecified: Secondary | ICD-10-CM | POA: Insufficient documentation

## 2021-02-13 DIAGNOSIS — R0981 Nasal congestion: Secondary | ICD-10-CM | POA: Diagnosis present

## 2021-02-13 DIAGNOSIS — Z7951 Long term (current) use of inhaled steroids: Secondary | ICD-10-CM | POA: Insufficient documentation

## 2021-02-13 LAB — POCT RAPID STREP A: Streptococcus, Group A Screen (Direct): NEGATIVE

## 2021-02-13 MED ORDER — BENZONATATE 100 MG PO CAPS: 200.0000 mg | ORAL_CAPSULE | Freq: Three times a day (TID) | ORAL | 0 refills | Status: AC | PRN

## 2021-02-13 MED ORDER — ALBUTEROL SULFATE HFA 108 (90 BASE) MCG/ACT IN AERS: 1.0000 | INHALATION_SPRAY | Freq: Four times a day (QID) | RESPIRATORY_TRACT | 2 refills | Status: DC | PRN

## 2021-02-13 MED ORDER — ONDANSETRON HCL 4 MG PO TABS: 4.0000 mg | ORAL_TABLET | Freq: Four times a day (QID) | ORAL | 0 refills | Status: AC | PRN

## 2021-02-13 NOTE — ED Provider Notes (Signed)
[facility]    CSN: [id number] Arrival date & time: 02/13/21  1843      History   Chief Complaint Chief Complaint  Patient presents with   Emesis   Appointment   Diarrhea    HPI Susan Swanson is a 25 y.o. female presenting for 3-day history of fatigue, sore throat, cough, congestion, headaches, nausea/vomiting/diarrhea.  Patient states 4 coworkers recently tested positive for COVID-19.  She has not been vaccinated for COVID-19.  She does have history of asthma.  States she has not been short of breath but is out of her albuterol inhaler in case she needs it.  She has not been taking any over-the-counter medication for her symptoms.  No other concerns at this time.  HPI  Past Medical History:  Diagnosis Date   Anxiety    Asthma    Syncope     Patient Active Problem List   Diagnosis Date Noted   Alcohol intoxication (HCC) 01/09/2021   Substance induced mood disorder (HCC) 01/09/2021   Morbid obesity with BMI of 50.0-59.9, adult (HCC) 08/12/2018   History of herpes genitalis 12/25/2017   History of asthma 06/16/2017   Hx of self-harm 06/16/2017   Recurrent major depressive disorder, in full remission (HCC) 11/09/2014   Asthma 11/23/2012    Past Surgical History:  Procedure Laterality Date   CESAREAN SECTION      OB History   No obstetric history on file.      Home Medications    Prior to Admission medications   Medication Sig Start Date End Date Taking? Authorizing Provider  albuterol (VENTOLIN HFA) 108 (90 Base) MCG/ACT inhaler Inhale 1-2 puffs into the lungs every 6 (six) hours as needed for wheezing or shortness of breath. 02/13/21 02/13/22 Yes Shirlee LatchEaves, Milcah Dulany B, PA-C  benzonatate (TESSALON) 100 MG capsule Take 2 capsules (200 mg total) by mouth 3 (three) times daily as needed for up to 7 days for cough. 02/13/21 02/20/21 Yes Eusebio FriendlyEaves, Oluwanifemi Susman B, PA-C  ondansetron (ZOFRAN) 4 MG tablet Take 1 tablet (4 mg total) by mouth every 6 (six) hours as needed  for up to 5 days for nausea or vomiting. 02/13/21 02/18/21 Yes Shirlee LatchEaves, Nikitha Mode B, PA-C  acyclovir (ZOVIRAX) 800 MG tablet  07/13/20   [provider]  meclizine (ANTIVERT) 25 MG tablet Take 1 tablet (25 mg total) by mouth 3 (three) times daily as needed for dizziness. 12/03/20   Tommie Samsook, Jayce G, DO  naloxone [facility](NARCAN) nasal spray 4 mg/0.1 mL Take as needed for overdose 12/09/20   Concha SeFunke, Mary E, MD  ondansetron (ZOFRAN ODT) 4 MG disintegrating tablet Take 1 tablet (4 mg total) by mouth every 8 (eight) hours as needed for nausea or vomiting. 12/09/20   Concha SeFunke, Mary E, MD  potassium chloride (KLOR-CON) 10 MEQ tablet Take 2 tablets (20 mEq total) by mouth daily for 4 days. 12/09/20 12/13/20  Concha SeFunke, Mary E, MD  SPRINTEC 28 0.25-35 MG-MCG tablet Take 1 tablet by mouth once daily 11/13/20   Matt HolmesHampton, Carla J, PA  budesonide-formoterol [facility](SYMBICORT) 80-4.5 MCG/ACT inhaler Inhale 2 puffs into the lungs 2 (two) times daily. Patient not taking: Reported on 07/13/2020 08/09/18 08/08/20  Tommie Samsook, Jayce G, DO  norethindrone (JENCYCLA) 0.35 MG tablet Take 1 tablet by mouth daily.  05/23/19  [provider]    Family History Family History  Problem Relation Age of Onset   Osteoarthritis Mother    Depression Mother    Healthy Father     Social History  Social History   Tobacco Use   Smoking status: Every Day    Packs/day: 0.50    Pack years: 0.00    Types: Cigarettes   Smokeless tobacco: Never  Vaping Use   Vaping Use: Never used  Substance Use Topics   Alcohol use: Yes   Drug use: Yes    Types: Marijuana    Comment: pt states she has a hx of drug addiciton; pt states she used any and everything.      Allergies   Doxycycline   Review of Systems Review of Systems  Constitutional:  Positive for chills and fatigue. Negative for diaphoresis and fever.  HENT:  Positive for congestion, rhinorrhea and sore throat. Negative for ear pain, sinus pressure and sinus pain.   Respiratory:  Positive for  cough. Negative for shortness of breath.   Cardiovascular:  Negative for chest pain.  Gastrointestinal:  Positive for nausea and vomiting. Negative for abdominal pain.  Musculoskeletal:  Negative for arthralgias and myalgias.  Skin:  Negative for rash.  Neurological:  Positive for headaches. Negative for weakness.  Hematological:  Negative for adenopathy.    Physical Exam Triage Vital Signs ED Triage Vitals  Enc Vitals Group     BP 02/13/21 1859 102/71     Pulse Rate 02/13/21 1859 (!) 108     Resp 02/13/21 1859 20     Temp 02/13/21 1859 98.7 F (37.1 C)     Temp Source 02/13/21 1859 Oral     SpO2 02/13/21 1859 97 %     Weight 02/13/21 1856 299 lb 13.2 oz (136 kg)     Height 02/13/21 1856 5\' 5"  (1.651 m)     Head Circumference --      Peak Flow --      Pain Score 02/13/21 1856 7     Pain Loc --      Pain Edu? --      Excl. in GC? --    No data found.  Updated Vital Signs BP 102/71 (BP Location: Left Arm)   Pulse (!) 108   Temp 98.7 F (37.1 C) (Oral)   Resp 20   Ht 5\' 5"  (1.651 m)   Wt 299 lb 13.2 oz (136 kg)   LMP 02/09/2021 (Approximate)   SpO2 97%   BMI 49.89 kg/m       Physical Exam Vitals and nursing note reviewed.  Constitutional:      General: She is not in acute distress.    Appearance: Normal appearance. She is obese. She is ill-appearing. She is not toxic-appearing.  HENT:     Head: Normocephalic and atraumatic.     Nose: Congestion and rhinorrhea present.     Mouth/Throat:     Mouth: Mucous membranes are moist.     Pharynx: Oropharynx is clear. Posterior oropharyngeal erythema present.     Tonsils: Tonsillar exudate (bilateral white exudates) present. 1+ on the right. 1+ on the left.  Eyes:     General: No scleral icterus.       Right eye: No discharge.        Left eye: No discharge.     Conjunctiva/sclera: Conjunctivae normal.  Cardiovascular:     Rate and Rhythm: Regular rhythm. Tachycardia present.     Heart sounds: Normal heart sounds.   Pulmonary:     Effort: Pulmonary effort is normal. No respiratory distress.     Breath sounds: Normal breath sounds.  Musculoskeletal:     Cervical back: Neck supple.  Skin:    General: Skin is dry.  Neurological:     General: No focal deficit present.     Mental Status: She is alert. Mental status is at baseline.     Motor: No weakness.     Gait: Gait normal.  Psychiatric:        Mood and Affect: Mood normal.        Behavior: Behavior normal.        Thought Content: Thought content normal.     UC Treatments / Results  Labs (all labs ordered are listed, but only abnormal results are displayed) Labs Reviewed  SARS CORONAVIRUS 2 (TAT 6-24 HRS)  CULTURE, GROUP A STREP [facility])  POCT RAPID STREP A, ED / UC  POCT RAPID STREP A    EKG   Radiology No results found.  Procedures Procedures (including critical care time)  Medications Ordered in UC Medications - No data to display  Initial Impression / Assessment and Plan / UC Course  I have reviewed the triage vital signs and the nursing notes.  Pertinent labs & imaging results that were available during my care of the patient were reviewed by me and considered in my medical decision making (see chart for details).  25 year old female presenting for 3-day history of fatigue, cough, congestion, sore throat, and/V/D.  Positive exposure to COVID-19 through coworkers.  She does have history of asthma and is not vaccinated.  Vitals are stable in the clinic today.  She is ill-appearing but nontoxic.  Exam significant for bilateral tonsillar enlargement and exudates and nasal congestion with clear rhinorrhea.  Chest is clear to auscultation.  Rapid strep test performed today.  Negative.  Culture sent.  Will treat for strep if culture is positive.  PCR COVID test performed.  Current CDC guidance, isolation protocol and ED precautions reviewed with patient.  Since she is not vaccinated and has a history of asthma and obesity, she a  candidate for antiviral medication.  Patient says she would be open to antiviral treatment.  Advised that this is a viral illness.  Encouraged her to increase rest and fluids.  Sent in Zofran, benzonatate and refilled her albuterol inhaler.  Advised her to follow-up with [place] as needed or to the ED for any acute worsening of symptoms.  Work note given.   Final Clinical Impressions(s) / UC Diagnoses   Final diagnoses:  Viral illness  Cough  Nasal congestion  Sore throat     Discharge Instructions      URI/COLD SYMPTOMS: Your exam today is consistent with a viral illness. Antibiotics are not indicated at this time. Use medications as directed, including cough syrup, nasal saline, and decongestants. Your symptoms should improve over the next few days and resolve within 7-10 days. Increase rest and fluids. F/u if symptoms worsen or predominate such as sore throat, ear pain, productive cough, shortness of breath, or if you develop high fevers or worsening fatigue over the next several days.    You have received COVID testing today either for positive exposure, concerning symptoms that could be related to COVID infection, screening purposes, or re-testing after confirmed positive.  Your test obtained today checks for active viral infection in the last 1-2 weeks. If your test is negative now, you can still test positive later. So, if you do develop symptoms you should either get re-tested and/or isolate x 5 days and then strict mask use x 5 days (unvaccinated) or mask use x 10 days (vaccinated). Please follow CDC guidelines.  While Rapid antigen tests come back in 15-20 minutes, send out PCR/molecular test results typically come back within 1-3 days. In the mean time, if you are symptomatic, assume this could be a positive test and treat/monitor yourself as if you do have COVID.   We will call with test results if positive. Please download the MyChart app and set up a profile to access test results.    If symptomatic, go home and rest. Push fluids. Take Tylenol as needed for discomfort. Gargle warm salt water. Throat lozenges. Take Mucinex DM or Robitussin for cough. Humidifier in bedroom to ease coughing. Warm showers. Also review the COVID handout for more information.  COVID-19 INFECTION: The incubation period of COVID-19 is approximately 14 days after exposure, with most symptoms developing in roughly 4-5 days. Symptoms may range in severity from mild to critically severe. Roughly 80% of those infected will have mild symptoms. People of any age may become infected with COVID-19 and have the ability to transmit the virus. The most common symptoms include: fever, fatigue, cough, body aches, headaches, sore throat, nasal congestion, shortness of breath, nausea, vomiting, diarrhea, changes in smell and/or taste.    COURSE OF ILLNESS Some patients may begin with mild disease which can progress quickly into critical symptoms. If your symptoms are worsening please call ahead to the Emergency Department and proceed there for further treatment. Recovery time appears to be roughly 1-2 weeks for mild symptoms and 3-6 weeks for severe disease.   GO IMMEDIATELY TO ER FOR FEVER YOU ARE UNABLE TO GET DOWN WITH TYLENOL, BREATHING PROBLEMS, CHEST PAIN, FATIGUE, LETHARGY, INABILITY TO EAT OR DRINK, ETC  QUARANTINE AND ISOLATION: To help decrease the spread of COVID-19 please remain isolated if you have COVID infection or are highly suspected to have COVID infection. This means -stay home and isolate to one room in the home if you live with others. Do not share a bed or bathroom with others while ill, sanitize and wipe down all countertops and keep common areas clean and disinfected. Stay home for 5 days. If you have no symptoms or your symptoms are resolving after 5 days, you can leave your house. Continue to wear a mask around others for 5 additional days. If you have been in close contact (within 6 feet) of  someone diagnosed with COVID 19, you are advised to quarantine in your home for 14 days as symptoms can develop anywhere from 2-14 days after exposure to the virus. If you develop symptoms, you  must isolate.  Most current guidelines for COVID after exposure -unvaccinated: isolate 5 days and strict mask use x 5 days. Test on day 5 is possible -vaccinated: wear mask x 10 days if symptoms do not develop -You do not necessarily need to be tested for COVID if you have + exposure and  develop symptoms. Just isolate at home x10 days from symptom onset During this global pandemic, CDC advises to practice social distancing, try to stay at least 51ft away from others at all times. Wear a face covering. Wash and sanitize your hands regularly and avoid going anywhere that is not necessary.  KEEP IN MIND THAT THE COVID TEST IS NOT 100% ACCURATE AND YOU SHOULD STILL DO EVERYTHING TO PREVENT POTENTIAL SPREAD OF VIRUS TO OTHERS (WEAR MASK, WEAR GLOVES, WASH HANDS AND SANITIZE REGULARLY). IF INITIAL TEST IS NEGATIVE, THIS MAY NOT MEAN YOU ARE DEFINITELY NEGATIVE. MOST ACCURATE TESTING IS DONE 5-7 DAYS AFTER EXPOSURE.   It is not advised by  CDC to get re-tested after receiving a positive COVID test since you can still test positive for weeks to months after you have already cleared the virus.   *If you have not been vaccinated for COVID, I strongly suggest you consider getting vaccinated as long as there are no contraindications.       ED Prescriptions     Medication Sig Dispense Auth. Provider   ondansetron (ZOFRAN) 4 MG tablet Take 1 tablet (4 mg total) by mouth every 6 (six) hours as needed for up to 5 days for nausea or vomiting. 20 tablet Eusebio Friendly B, PA-C   benzonatate (TESSALON) 100 MG capsule Take 2 capsules (200 mg total) by mouth 3 (three) times daily as needed for up to 7 days for cough. 21 capsule Eusebio Friendly B, PA-C   albuterol (VENTOLIN HFA) 108 (90 Base) MCG/ACT inhaler Inhale 1-2 puffs into  the lungs every 6 (six) hours as needed for wheezing or shortness of breath. 8 g Gareth Morgan      PDMP not reviewed this encounter.   Shirlee Latch, PA-C 02/13/21 1952

## 2021-02-13 NOTE — Discharge Instructions (Addendum)

## 2021-02-13 NOTE — ED Triage Notes (Signed)
Pt c/o diarrhea, vomiting, chills, sore throat, cough, headache. Started 2 days ago. She states she has been around someone with covid recently.

## 2021-02-14 LAB — SARS CORONAVIRUS 2 (TAT 6-24 HRS): SARS Coronavirus 2: NEGATIVE

## 2021-02-15 LAB — CULTURE, GROUP A STREP (THRC)

## 2021-04-05 ENCOUNTER — Other Ambulatory Visit: Payer: Self-pay

## 2021-04-05 ENCOUNTER — Encounter: Payer: Self-pay | Admitting: Internal Medicine

## 2021-04-05 ENCOUNTER — Inpatient Hospital Stay
Admission: EM | Admit: 2021-04-05 | Discharge: 2021-04-07 | DRG: 177 | Disposition: A | Discharge status: Home / Self Care | Payer: Medicaid Other | Attending: Internal Medicine | Admitting: Internal Medicine

## 2021-04-05 ENCOUNTER — Emergency Department: Payer: Medicaid Other

## 2021-04-05 DIAGNOSIS — R739 Hyperglycemia, unspecified: Secondary | ICD-10-CM | POA: Diagnosis present

## 2021-04-05 DIAGNOSIS — Z818 Family history of other mental and behavioral disorders: Secondary | ICD-10-CM

## 2021-04-05 DIAGNOSIS — T50901A Poisoning by unspecified drugs, medicaments and biological substances, accidental (unintentional), initial encounter: Secondary | ICD-10-CM | POA: Diagnosis not present

## 2021-04-05 DIAGNOSIS — J9600 Acute respiratory failure, unspecified whether with hypoxia or hypercapnia: Secondary | ICD-10-CM | POA: Diagnosis not present

## 2021-04-05 DIAGNOSIS — E66813 Obesity, class 3: Secondary | ICD-10-CM | POA: Diagnosis present

## 2021-04-05 DIAGNOSIS — F1911 Other psychoactive substance abuse, in remission: Secondary | ICD-10-CM | POA: Diagnosis present

## 2021-04-05 DIAGNOSIS — F17213 Nicotine dependence, cigarettes, with withdrawal: Secondary | ICD-10-CM

## 2021-04-05 DIAGNOSIS — Z9151 Personal history of suicidal behavior: Secondary | ICD-10-CM | POA: Diagnosis not present

## 2021-04-05 DIAGNOSIS — Z7951 Long term (current) use of inhaled steroids: Secondary | ICD-10-CM | POA: Diagnosis not present

## 2021-04-05 DIAGNOSIS — F419 Anxiety disorder, unspecified: Secondary | ICD-10-CM | POA: Diagnosis present

## 2021-04-05 DIAGNOSIS — F1914 Other psychoactive substance abuse with psychoactive substance-induced mood disorder: Secondary | ICD-10-CM | POA: Diagnosis present

## 2021-04-05 DIAGNOSIS — Z79899 Other long term (current) drug therapy: Secondary | ICD-10-CM

## 2021-04-05 DIAGNOSIS — J690 Pneumonitis due to inhalation of food and vomit: Secondary | ICD-10-CM | POA: Diagnosis present

## 2021-04-05 DIAGNOSIS — Z6841 Body Mass Index (BMI) 40.0 and over, adult: Secondary | ICD-10-CM

## 2021-04-05 DIAGNOSIS — J9601 Acute respiratory failure with hypoxia: Secondary | ICD-10-CM | POA: Diagnosis present

## 2021-04-05 DIAGNOSIS — F1010 Alcohol abuse, uncomplicated: Secondary | ICD-10-CM | POA: Diagnosis present

## 2021-04-05 DIAGNOSIS — F17210 Nicotine dependence, cigarettes, uncomplicated: Secondary | ICD-10-CM | POA: Diagnosis present

## 2021-04-05 DIAGNOSIS — T40411A Poisoning by fentanyl or fentanyl analogs, accidental (unintentional), initial encounter: Secondary | ICD-10-CM | POA: Diagnosis present

## 2021-04-05 DIAGNOSIS — J45909 Unspecified asthma, uncomplicated: Secondary | ICD-10-CM | POA: Diagnosis present

## 2021-04-05 DIAGNOSIS — R000 Tachycardia, unspecified: Secondary | ICD-10-CM | POA: Diagnosis present

## 2021-04-05 DIAGNOSIS — Z881 Allergy status to other antibiotic agents status: Secondary | ICD-10-CM | POA: Diagnosis not present

## 2021-04-05 DIAGNOSIS — Z20822 Contact with and (suspected) exposure to covid-19: Secondary | ICD-10-CM | POA: Diagnosis present

## 2021-04-05 DIAGNOSIS — F17200 Nicotine dependence, unspecified, uncomplicated: Secondary | ICD-10-CM | POA: Diagnosis present

## 2021-04-05 DIAGNOSIS — R0902 Hypoxemia: Secondary | ICD-10-CM

## 2021-04-05 DIAGNOSIS — R0603 Acute respiratory distress: Secondary | ICD-10-CM

## 2021-04-05 LAB — TROPONIN I (HIGH SENSITIVITY)
Troponin I (High Sensitivity): 5 ng/L (ref <18)
Troponin I (High Sensitivity): 35 ng/L — ABNORMAL HIGH (ref <18)

## 2021-04-05 LAB — COMPREHENSIVE METABOLIC PANEL
ALT: 25 U/L (ref 0–44)
AST: 42 U/L — ABNORMAL HIGH (ref 15–41)
Albumin: 3.2 g/dL — ABNORMAL LOW (ref 3.5–5.0)
Alkaline Phosphatase: 84 U/L (ref 38–126)
Anion gap: 7 (ref 5–15)
BUN: 7 mg/dL (ref 6–20)
CO2: 23 mmol/L (ref 22–32)
Calcium: 8.1 mg/dL — ABNORMAL LOW (ref 8.9–10.3)
Chloride: 106 mmol/L (ref 98–111)
Creatinine, Ser: 1.01 mg/dL — ABNORMAL HIGH (ref 0.44–1.00)
GFR, Estimated: >60 mL/min (ref >60)
Glucose, Bld: 258 mg/dL — ABNORMAL HIGH (ref 70–99)
Potassium: 4.1 mmol/L (ref 3.5–5.1)
Sodium: 136 mmol/L (ref 135–145)
Total Bilirubin: 0.6 mg/dL (ref 0.3–1.2)
Total Protein: 7.1 g/dL (ref 6.5–8.1)

## 2021-04-05 LAB — BRAIN NATRIURETIC PEPTIDE: B Natriuretic Peptide: 65.8 pg/mL (ref 0.0–100.0)

## 2021-04-05 LAB — ACETAMINOPHEN LEVEL: Acetaminophen (Tylenol), Serum: <10 ug/mL — ABNORMAL LOW (ref 10–30)

## 2021-04-05 LAB — RESP PANEL BY RT-PCR (FLU A&B, COVID) ARPGX2
Influenza A by PCR: NEGATIVE
Influenza B by PCR: NEGATIVE
SARS Coronavirus 2 by RT PCR: NEGATIVE

## 2021-04-05 LAB — CBC WITH DIFFERENTIAL/PLATELET
Abs Immature Granulocytes: 0.21 10*3/uL — ABNORMAL HIGH (ref 0.00–0.07)
Basophils Absolute: 0.1 10*3/uL (ref 0.0–0.1)
Basophils Relative: 1 %
Eosinophils Absolute: 0.3 10*3/uL (ref 0.0–0.5)
Eosinophils Relative: 2 %
HCT: 42 % (ref 36.0–46.0)
Hemoglobin: 13.8 g/dL (ref 12.0–15.0)
Immature Granulocytes: 2 %
Lymphocytes Relative: 40 %
Lymphs Abs: 5.7 10*3/uL — ABNORMAL HIGH (ref 0.7–4.0)
MCH: 29.7 pg (ref 26.0–34.0)
MCHC: 32.9 g/dL (ref 30.0–36.0)
MCV: 90.5 fL (ref 80.0–100.0)
Monocytes Absolute: 1 10*3/uL (ref 0.1–1.0)
Monocytes Relative: 7 %
Neutro Abs: 6.8 10*3/uL (ref 1.7–7.7)
Neutrophils Relative %: 48 %
Platelets: 283 10*3/uL (ref 150–400)
RBC: 4.64 MIL/uL (ref 3.87–5.11)
RDW: 12.8 % (ref 11.5–15.5)
Smear Review: NORMAL
WBC: 14.1 10*3/uL — ABNORMAL HIGH (ref 4.0–10.5)
nRBC: 0 % (ref 0.0–0.2)

## 2021-04-05 LAB — URINALYSIS, COMPLETE (UACMP) WITH MICROSCOPIC
Bacteria, UA: NONE SEEN
Bilirubin Urine: NEGATIVE
Glucose, UA: 50 mg/dL — AB
Hgb urine dipstick: NEGATIVE
Ketones, ur: NEGATIVE mg/dL
Leukocytes,Ua: NEGATIVE
Nitrite: NEGATIVE
Protein, ur: NEGATIVE mg/dL
Specific Gravity, Urine: 1.038 — ABNORMAL HIGH (ref 1.005–1.030)
pH: 6 (ref 5.0–8.0)

## 2021-04-05 LAB — URINE DRUG SCREEN, QUALITATIVE (ARMC ONLY)
Amphetamines, Ur Screen: NOT DETECTED
Barbiturates, Ur Screen: NOT DETECTED
Benzodiazepine, Ur Scrn: POSITIVE — AB
Cannabinoid 50 Ng, Ur ~~LOC~~: POSITIVE — AB
Cocaine Metabolite,Ur ~~LOC~~: POSITIVE — AB
MDMA (Ecstasy)Ur Screen: NOT DETECTED
Methadone Scn, Ur: NOT DETECTED
Opiate, Ur Screen: NOT DETECTED
Phencyclidine (PCP) Ur S: NOT DETECTED
Tricyclic, Ur Screen: NOT DETECTED

## 2021-04-05 LAB — HEMOGLOBIN A1C
Hgb A1c MFr Bld: 5.1 % (ref 4.8–5.6)
Mean Plasma Glucose: 99.67 mg/dL

## 2021-04-05 LAB — LACTIC ACID, PLASMA: Lactic Acid, Venous: 1.7 mmol/L (ref 0.5–1.9)

## 2021-04-05 LAB — HCG, QUANTITATIVE, PREGNANCY: hCG, Beta Chain, Quant, S: <1 m[IU]/mL (ref <5)

## 2021-04-05 LAB — LIPASE, BLOOD: Lipase: 29 U/L (ref 11–51)

## 2021-04-05 LAB — CBG MONITORING, ED
Glucose-Capillary: 101 mg/dL — ABNORMAL HIGH (ref 70–99)
Glucose-Capillary: 97 mg/dL (ref 70–99)

## 2021-04-05 LAB — ETHANOL: Alcohol, Ethyl (B): <10 mg/dL (ref <10)

## 2021-04-05 LAB — SALICYLATE LEVEL: Salicylate Lvl: <7 mg/dL — ABNORMAL LOW (ref 7.0–30.0)

## 2021-04-05 MED ORDER — NORGESTIMATE-ETH ESTRADIOL 0.25-35 MG-MCG PO TABS: 1.0000 | ORAL_TABLET | Freq: Every day | ORAL | Status: DC

## 2021-04-05 MED ORDER — ONDANSETRON HCL 4 MG/2ML IJ SOLN
4.0000 mg | INTRAMUSCULAR | Status: DC | PRN
  Administered 2021-04-06: 4 mg via INTRAVENOUS
  Filled 2021-04-05 (×2): qty 2

## 2021-04-05 MED ORDER — IPRATROPIUM-ALBUTEROL 0.5-2.5 (3) MG/3ML IN SOLN
3.0000 mL | Freq: Four times a day (QID) | RESPIRATORY_TRACT | Status: DC | PRN
  Administered 2021-04-06 – 2021-04-07 (×2): 3 mL via RESPIRATORY_TRACT
  Filled 2021-04-05 (×2): qty 3

## 2021-04-05 MED ORDER — SODIUM CHLORIDE 0.9 % IV SOLN
3.0000 g | Freq: Four times a day (QID) | INTRAVENOUS | Status: DC
  Administered 2021-04-05 – 2021-04-07 (×9): 3 g via INTRAVENOUS
  Filled 2021-04-05: qty 3
  Filled 2021-04-05 (×2): qty 8
  Filled 2021-04-05: qty 3
  Filled 2021-04-05 (×5): qty 8
  Filled 2021-04-05 (×2): qty 3

## 2021-04-05 MED ORDER — SODIUM CHLORIDE 0.9% FLUSH: 3.0000 mL | INTRAVENOUS | Status: DC | PRN

## 2021-04-05 MED ORDER — IOHEXOL 350 MG/ML SOLN
75.0000 mL | Freq: Once | INTRAVENOUS | Status: AC | PRN
  Administered 2021-04-05: 75 mL via INTRAVENOUS

## 2021-04-05 MED ORDER — SODIUM CHLORIDE 0.9 % IV BOLUS
1000.0000 mL | Freq: Once | INTRAVENOUS | Status: AC
  Administered 2021-04-05: 1000 mL via INTRAVENOUS

## 2021-04-05 MED ORDER — ENOXAPARIN SODIUM 40 MG/0.4ML IJ SOSY: 40.0000 mg | PREFILLED_SYRINGE | INTRAMUSCULAR | Status: DC

## 2021-04-05 MED ORDER — SODIUM CHLORIDE 0.9 % IV SOLN: 250.0000 mL | INTRAVENOUS | Status: DC | PRN

## 2021-04-05 MED ORDER — SODIUM CHLORIDE 0.9% FLUSH
3.0000 mL | Freq: Two times a day (BID) | INTRAVENOUS | Status: DC
  Administered 2021-04-05 – 2021-04-07 (×4): 3 mL via INTRAVENOUS

## 2021-04-05 MED ORDER — ENOXAPARIN SODIUM 80 MG/0.8ML IJ SOSY
0.5000 mg/kg | PREFILLED_SYRINGE | INTRAMUSCULAR | Status: DC
  Administered 2021-04-05 – 2021-04-07 (×3): 67.5 mg via SUBCUTANEOUS
  Filled 2021-04-05: qty 0.68
  Filled 2021-04-05 (×2): qty 0.8

## 2021-04-05 MED ORDER — ONDANSETRON HCL 4 MG/2ML IJ SOLN
4.0000 mg | Freq: Once | INTRAMUSCULAR | Status: AC
  Administered 2021-04-05: 4 mg via INTRAVENOUS

## 2021-04-05 MED ORDER — ACETAMINOPHEN 325 MG PO TABS
650.0000 mg | ORAL_TABLET | Freq: Four times a day (QID) | ORAL | Status: DC | PRN
  Administered 2021-04-05 – 2021-04-06 (×4): 650 mg via ORAL
  Filled 2021-04-05 (×4): qty 2

## 2021-04-05 MED ORDER — ONDANSETRON HCL 4 MG/2ML IJ SOLN
INTRAMUSCULAR | Status: AC
  Filled 2021-04-05: qty 2

## 2021-04-05 MED ORDER — NICOTINE 14 MG/24HR TD PT24
14.0000 mg | MEDICATED_PATCH | Freq: Every day | TRANSDERMAL | Status: DC
  Administered 2021-04-06: 14 mg via TRANSDERMAL
  Filled 2021-04-05: qty 1

## 2021-04-05 MED ORDER — SODIUM CHLORIDE 0.9 % IV SOLN
3.0000 g | Freq: Once | INTRAVENOUS | Status: AC
  Administered 2021-04-05: 3 g via INTRAVENOUS
  Filled 2021-04-05: qty 8

## 2021-04-05 MED ORDER — SODIUM CHLORIDE 0.9 % IV SOLN: INTRAVENOUS | Status: DC

## 2021-04-05 NOTE — ED Notes (Signed)
Pt offered a lunch tray. Pt requested apple juice, and accepted lunch tray

## 2021-04-05 NOTE — ED Provider Notes (Signed)
[facility] Emergency Department Provider Note   ____________________________________________   Event Date/Time   First MD Initiated Contact with Patient 04/05/21 0310     (approximate)  I have reviewed the triage vital signs and the nursing notes.   HISTORY  Chief Complaint Drug Overdose (Ems reports unresponsive/agonal/pinpoint pupils gcs 3, given 4mg  IN narcan with gcs 4-5. Hx drug abuse. Upon arrival and move to ed stretcher, pt wakes up and is a/o, vomiting and admits to fentanyl use)    HPI Susan Swanson is a 25 y.o. female brought to the ED via EMS from home with a chief complaint of accidental fentanyl overdose.  Patient with a history of substance abuse who denies intentional ingestion.  She was found by first responders unresponsive with pinpoint pupils and agonal breathing.  She was given 2 mg intranasal Narcan.  She was still unresponsive upon EMS arrival and received an additional 2 mg intranasal Narcan.  Arrives to the emergency department awake, alert, cooperative with interview and vomiting.  Hypoxic to 86% on room air.     Past Medical History:  Diagnosis Date   Anxiety    Asthma    Syncope     Patient Active Problem List   Diagnosis Date Noted   Aspiration pneumonia (HCC) 04/05/2021   Alcohol intoxication (HCC) 01/09/2021   Substance induced mood disorder (HCC) 01/09/2021   Morbid obesity with BMI of 50.0-59.9, adult (HCC) 08/12/2018   History of herpes genitalis 12/25/2017   History of asthma 06/16/2017   Hx of self-harm 06/16/2017   Recurrent major depressive disorder, in full remission (HCC) 11/09/2014   Asthma 11/23/2012    Past Surgical History:  Procedure Laterality Date   CESAREAN SECTION      Prior to Admission medications   Medication Sig Start Date End Date Taking? Authorizing Provider  acyclovir (ZOVIRAX) 800 MG tablet  07/13/20   [provider]  albuterol (VENTOLIN HFA) 108 (90 Base) MCG/ACT  inhaler Inhale 1-2 puffs into the lungs every 6 (six) hours as needed for wheezing or shortness of breath. 02/13/21 02/13/22  Shirlee LatchEaves, Lesley B, PA-C  meclizine (ANTIVERT) 25 MG tablet Take 1 tablet (25 mg total) by mouth 3 (three) times daily as needed for dizziness. 12/03/20   Tommie Samsook, Jayce G, DO  naloxone [facility](NARCAN) nasal spray 4 mg/0.1 mL Take as needed for overdose 12/09/20   Concha SeFunke, Mary E, MD  ondansetron (ZOFRAN ODT) 4 MG disintegrating tablet Take 1 tablet (4 mg total) by mouth every 8 (eight) hours as needed for nausea or vomiting. 12/09/20   Concha SeFunke, Mary E, MD  potassium chloride (KLOR-CON) 10 MEQ tablet Take 2 tablets (20 mEq total) by mouth daily for 4 days. 12/09/20 12/13/20  Concha SeFunke, Mary E, MD  SPRINTEC 28 0.25-35 MG-MCG tablet Take 1 tablet by mouth once daily 11/13/20   Matt HolmesHampton, Carla J, PA  budesonide-formoterol [facility](SYMBICORT) 80-4.5 MCG/ACT inhaler Inhale 2 puffs into the lungs 2 (two) times daily. Patient not taking: Reported on 07/13/2020 08/09/18 08/08/20  Tommie Samsook, Jayce G, DO  norethindrone (JENCYCLA) 0.35 MG tablet Take 1 tablet by mouth daily.  05/23/19  [provider]    Allergies Doxycycline  Family History  Problem Relation Age of Onset   Osteoarthritis Mother    Depression Mother    Healthy Father     Social History Social History   Tobacco Use   Smoking status: Every Day    Packs/day: 0.50    Types: Cigarettes   Smokeless tobacco:  Never  Vaping Use   Vaping Use: Never used  Substance Use Topics   Alcohol use: Yes   Drug use: Yes    Types: Marijuana    Comment: pt states she has a hx of drug addiciton; pt states she used any and everything.     Review of Systems  Constitutional: No fever/chills Eyes: No visual changes. ENT: No sore throat. Cardiovascular: Denies chest pain. Respiratory: Positive for shortness of breath. Gastrointestinal: No abdominal pain.  Positive for vomiting.  No diarrhea.  No constipation. Genitourinary: Negative for  dysuria. Musculoskeletal: Negative for back pain. Skin: Negative for rash. Neurological: Negative for headaches, focal weakness or numbness.   ____________________________________________   PHYSICAL EXAM:  VITAL SIGNS: ED Triage Vitals  Enc Vitals Group     BP --      Pulse --      Resp --      Temp --      Temp src --      SpO2 04/05/21 0303 90 %     Weight 04/05/21 0309 299 lb 13.2 oz (136 kg)     Height 04/05/21 0309 5\' 5"  (1.651 m)     Head Circumference --      Peak Flow --      Pain Score 04/05/21 0309 0     Pain Loc --      Pain Edu? --      Excl. in GC? --     Constitutional: Alert and oriented.  Disheveled appearing and in moderate acute distress. Eyes: Conjunctivae are normal. PERRL. EOMI. Head: Atraumatic. Nose: No congestion/rhinnorhea. Mouth/Throat: Mucous membranes are moist.   Neck: No stridor.   Cardiovascular: Tachycardic rate, regular rhythm. Grossly normal heart sounds.  Good peripheral circulation. Respiratory: Increased respiratory effort.  No retractions. Lungs with rhonchi and bibasilar rales. Gastrointestinal: Obese.  Actively vomiting.  Soft and nontender to light or deep palpation. No distention. No abdominal bruits. No CVA tenderness. Musculoskeletal: No lower extremity tenderness nor edema.  No joint effusions. Neurologic: Alert and oriented x3.  CN II to XII grossly intact.  Normal speech and language. No gross focal neurologic deficits are appreciated.  Skin:  Skin is warm, dry and intact. No rash noted. Psychiatric: Mood and affect are normal. Speech and behavior are normal.  ____________________________________________   LABS (all labs ordered are listed, but only abnormal results are displayed)  Labs Reviewed  CBC WITH DIFFERENTIAL/PLATELET - Abnormal; Notable for the following components:      Result Value   WBC 14.1 (*)    Lymphs Abs 5.7 (*)    Abs Immature Granulocytes 0.21 (*)    All other components within normal limits   COMPREHENSIVE METABOLIC PANEL - Abnormal; Notable for the following components:   Glucose, Bld 258 (*)    Creatinine, Ser 1.01 (*)    Calcium 8.1 (*)    Albumin 3.2 (*)    AST 42 (*)    All other components within normal limits  ACETAMINOPHEN LEVEL - Abnormal; Notable for the following components:   Acetaminophen (Tylenol), Serum <10 (*)    All other components within normal limits  RESP PANEL BY RT-PCR (FLU A&B, COVID) ARPGX2  ETHANOL  BRAIN NATRIURETIC PEPTIDE  LIPASE, BLOOD  HCG, QUANTITATIVE, PREGNANCY  LACTIC ACID, PLASMA  LACTIC ACID, PLASMA  URINALYSIS, COMPLETE (UACMP) WITH MICROSCOPIC  URINE DRUG SCREEN, QUALITATIVE ([facility] ONLY)  SALICYLATE LEVEL  POC URINE PREG, ED  TROPONIN I (HIGH SENSITIVITY)  TROPONIN I (HIGH SENSITIVITY)  ____________________________________________  EKG  ED ECG REPORT I, Ivionna Verley J, the attending physician, personally viewed and interpreted this ECG.   Date: 04/05/2021  EKG Time: 0336  Rate: 107  Rhythm: sinus tachycardia  Axis: Normal  Intervals:none  ST&T Change: Nonspecific  ____________________________________________  RADIOLOGY I, Quorra Rosene J, personally viewed and evaluated these images (plain radiographs) as part of my medical decision making, as well as reviewing the written report by the radiologist.  ED MD interpretation: Chest x-ray clear; CTA demonstrates bronchopneumonia, no PE  Official radiology report(s): CT Angio Chest PE W/Cm &/Or Wo Cm  Result Date: 04/05/2021 CLINICAL DATA:  25 year old female with history of hypoxia. Drug overdose. Elevated D-dimer. EXAM: CT ANGIOGRAPHY CHEST WITH CONTRAST TECHNIQUE: Multidetector CT imaging of the chest was performed using the standard protocol during bolus administration of intravenous contrast. Multiplanar CT image reconstructions and MIPs were obtained to evaluate the vascular anatomy. CONTRAST:  71mL OMNIPAQUE IOHEXOL 350 MG/ML SOLN COMPARISON:  CT the chest, abdomen and  pelvis 08/02/2012. FINDINGS: Cardiovascular: Study is limited by considerable patient respiratory motion. With these limitations in mind, there is no central, lobar or proximal segmental sized pulmonary filling defect to suggest pulmonary embolus. More distal segmental and subsegmental sized emboli cannot be entirely excluded secondary to respiratory motion. Heart size is normal. There is no significant pericardial fluid, thickening or pericardial calcification. No atherosclerotic calcifications in the thoracic aorta or the coronary arteries. Mediastinum/Nodes: No pathologically enlarged mediastinal or hilar lymph nodes. Esophagus is unremarkable in appearance. No axillary lymphadenopathy. Lungs/Pleura: Patchy areas of airspace consolidation and ground-glass attenuation are noted throughout the lungs bilaterally, most severe in the right upper and lower lobes, likely sequela of severe aspiration with developing multilobar bronchopneumonia. No pleural effusions. No definite suspicious appearing pulmonary nodules or masses are noted. Upper Abdomen: Unremarkable. Musculoskeletal: There are no aggressive appearing lytic or blastic lesions noted in the visualized portions of the skeleton. Review of the MIP images confirms the above findings. IMPRESSION: 1. Limited study demonstrating no evidence of central, lobar or proximal segmental sized pulmonary embolism. 2. Severe multilobar bilateral bronchopneumonia versus chemical pneumonitis, likely sequela of massive aspiration in the setting of known drug overdose. Electronically Signed   By: Trudie Reed M.D.   On: 04/05/2021 06:44   DG Chest Port 1 View  Result Date: 04/05/2021 CLINICAL DATA:  Shortness of breath. Overdose. EXAM: PORTABLE CHEST 1 VIEW COMPARISON:  Chest radiograph dated 12/09/2020. FINDINGS: No focal consolidation, pleural effusion, pneumothorax. The cardiac silhouette is within limits. No acute osseous pathology. IMPRESSION: No active disease.  Electronically Signed   By: Elgie Collard M.D.   On: 04/05/2021 03:30    ____________________________________________   PROCEDURES  Procedure(s) performed (including Critical Care):  .1-3 Lead EKG Interpretation  Date/Time: 04/05/2021 3:15 AM Performed by: Irean Hong, MD Authorized by: Irean Hong, MD     Interpretation: abnormal     ECG rate:  106   ECG rate assessment: tachycardic     Rhythm: sinus tachycardia     Ectopy: none     Conduction: normal   Comments:     Patient placed on cardiac monitor to evaluate for arrhythmias  CRITICAL CARE Performed by: Irean Hong   Total critical care time: 60 minutes  Critical care time was exclusive of separately billable procedures and treating other patients.  Critical care was necessary to treat or prevent imminent or life-threatening deterioration.  Critical care was time spent personally by me on the following activities: development of treatment plan  with patient and/or surrogate as well as nursing, discussions with consultants, evaluation of patient's response to treatment, examination of patient, obtaining history from patient or surrogate, ordering and performing treatments and interventions, ordering and review of laboratory studies, ordering and review of radiographic studies, pulse oximetry and re-evaluation of patient's condition.  ____________________________________________   INITIAL IMPRESSION / ASSESSMENT AND PLAN / ED COURSE  As part of my medical decision making, I reviewed the following data within the electronic MEDICAL RECORD NUMBER Nursing notes reviewed and incorporated, Labs reviewed, EKG interpreted, Old chart reviewed, Radiograph reviewed, Discussed with admitting physician, and Notes from prior ED visits     25 year old female who presents in respiratory distress status post accidental fentanyl overdose requiring 4mg  intranasal Narcan.  Differential diagnosis includes but is not limited to aspiration,  pulmonary edema, community-acquired pneumonia, viral process, etc.  Will obtain sepsis protocol lab work, chest x-ray.  IV Zofran given for vomiting.  We will try high flow nasal cannula oxygen as patient is unable to tolerate any sort of facemask currently due to vomiting.  Clinical Course as of 04/05/21 0650  Fri Apr 05, 2021  Apr 07, 2021 Although chest x-ray currently looks clear, given patient's unresponsiveness and now vomiting, will administer IV Unasyn to cover aspiration.  Sats 91% on high flow nasal cannula. [JS]  0344 Sats 96% on 6 L nasal cannula oxygen [JS]  0357 Vomiting controlled.  Saturations 93 to 95%. [JS]  0545 Boyfriend at bedside.  With patient's permission, updated boyfriend of patient status. [JS]  8144 No PE on CTA chest; bronchopneumonia [JS]    Clinical Course User Index [JS] [id number], MD     ____________________________________________   FINAL CLINICAL IMPRESSION(S) / ED DIAGNOSES  Final diagnoses:  Accidental drug overdose, initial encounter  Hypoxia  Respiratory distress  Aspiration pneumonia due to gastric secretions, unspecified laterality, unspecified part of lung [facility])     ED Discharge Orders     None        Note:  This document was prepared using Dragon voice recognition software and may include unintentional dictation errors.    [facility], MD 04/05/21 [phone]

## 2021-04-05 NOTE — ED Notes (Signed)
Request made for transport  

## 2021-04-05 NOTE — ED Notes (Signed)
Pt placed on 4L [place]. Oxygen sats maintain 95-99%

## 2021-04-05 NOTE — ED Notes (Signed)
Pt ambulated self efficiently to toilet. Pt returned safely to bedside.

## 2021-04-05 NOTE — Progress Notes (Signed)
Patient ready for transport to the floor. Explained to her about her boyfriend not being able to stay and that he can return in the AM. Shift assessment deffered for floor RN.

## 2021-04-05 NOTE — ED Notes (Signed)
RN still waiting for MD orders

## 2021-04-05 NOTE — Progress Notes (Signed)
PHARMACIST - PHYSICIAN COMMUNICATION  CONCERNING:  Enoxaparin (Lovenox) for DVT Prophylaxis    RECOMMENDATION: Patient was prescribed enoxaprin 40mg  q24 hours for VTE prophylaxis.   Filed Weights   04/05/21 0309  Weight: 136 kg (299 lb 13.2 oz)    Body mass index is 49.89 kg/m.  Estimated Creatinine Clearance: 118.1 mL/min (A) (by C-G formula based on SCr of 1.01 mg/dL (H)).   Based on [facility] policy patient is candidate for enoxaparin 0.5mg /kg TBW SQ every 24 hours based on BMI being >30.  DESCRIPTION: Pharmacy has adjusted enoxaparin dose per [facility] policy.  Patient is now receiving enoxaparin 67.5 mg every 24 hours    [facility], PharmD, BCPS Clinical Pharmacist 04/05/2021 10:18 AM

## 2021-04-05 NOTE — ED Notes (Signed)
Pt BP reading low. Pt lying on left side with BP cuff on right forearm. Pt woken up and repositioned onto back. BP retaken and normotensive.

## 2021-04-05 NOTE — H&P (Addendum)
History and Physical    Susan PlowmanJeanice B Dollard UJW:[id number]:[id number] DOB: 04/30/1996 DOA: 04/05/2021  PCP: Patient, No Pcp Per (Inactive)   Patient coming from: Home  I have personally briefly [facility]  Chief Complaint: Unresponsiveness  HPI: Susan Swanson is a 25 y.o. female with medical history significant for substance abuse, anxiety disorder, asthma who was brought into the ER by EMS for evaluation of an accidental fentanyl overdose. Per EMS patient was found unresponsive, agonal with pinpoint pupils and a GCS of 3.  She received 2 mg intranasal Narcan and remained unresponsive by the time EMS arrived.  She received an additional 2 mg intranasal Narcan by EMS with improvement in her GCS and by the time she arrived to ER she was awake and was actively vomiting.  She had room air pulse oximetry of 86% upon her arrival and is currently on 6 L of oxygen to maintain pulse oximetry greater than 92%. Patient states that she took a pill which she thought was "oxy" and did not know it was fentanyl. She admits to a prior unintentional overdose with fentanyl in the past.  She denies having any suicidal or homicidal ideations. At the time of my interview she is lethargic but arouses to verbal stimuli and is able to provide history. She complains of nausea but denies having any abdominal pain, no chest pain, no cough, no fever, no chills, no headache, no leg swelling, no urinary frequency, no nocturia, no dysuria, no blurred vision no focal deficit. Labs show sodium 136, potassium 4.1, chloride 106, bicarb 23, glucose 258, BUN 7, creatinine 1.01, calcium 8.1, alkaline phosphatase 84, albumin 3.2, lipase 29, AST 42, ALT 25, total protein 7.1, BNP 65.8, troponin 35, lactic acid 1.7, white count 14.1, hemoglobin 13.8, hematocrit 42.0, MCV 90.5, RDW 12.8, platelet count 283 Salicylate level less than 7, Tylenol level less than 10 Respiratory viral panel is  negative Chest x-ray reviewed by me shows no evidence of acute cardiopulmonary disease CT angiogram of the chest shows limited study demonstrating no evidence of central, lobar or proximal segmental sized pulmonary embolism. Severe multilobar bilateral bronchopneumonia versus chemical pneumonitis, likely sequela of massive aspiration in the setting of known drug overdose. Twelve-lead EKG reviewed by me shows sinus tachycardia   ED Course: Patient is a 25 year old female who was brought in by EMS after she was found unresponsive with a GCS of 3.  Patient appeared to have an unintentional overdose on fentanyl. She received 4 mg of intranasal Narcan with improvement in her mental status and upon her arrival to the ER was actively vomiting. She was hypoxic in the field and measured pulse oximetry of 86% and is currently on 6 L of oxygen maintaining pulse oximetry greater than 92%. CT angiogram of the chest shows severe multilobar bilateral bronchopneumonia versus chemical pneumonitis, likely sequela of massive aspiration in the setting of known drug overdose. Patient received a dose of Unasyn and will be admitted to the hospital for further evaluation.   Review of Systems: As per HPI otherwise all other systems reviewed and negative.    Past Medical History:  Diagnosis Date   Anxiety    Asthma    Syncope     Past Surgical History:  Procedure Laterality Date   CESAREAN SECTION       reports that she has been smoking cigarettes. She has been smoking an average of .5 packs per day. She has never used smokeless tobacco. She reports  current alcohol use. She reports current drug use. Drug: Marijuana.  Allergies  Allergen Reactions   Doxycycline     Other reaction(s): dizzy    Family History  Problem Relation Age of Onset   Osteoarthritis Mother    Depression Mother    Healthy Father       Prior to Admission medications   Medication Sig Start Date End Date Taking? Authorizing  Provider  acyclovir (ZOVIRAX) 800 MG tablet  07/13/20   [provider]  albuterol (VENTOLIN HFA) 108 (90 Base) MCG/ACT inhaler Inhale 1-2 puffs into the lungs every 6 (six) hours as needed for wheezing or shortness of breath. 02/13/21 02/13/22  Shirlee Latch, PA-C  meclizine (ANTIVERT) 25 MG tablet Take 1 tablet (25 mg total) by mouth 3 (three) times daily as needed for dizziness. 12/03/20   Tommie Sams, DO  naloxone [facility]) nasal spray 4 mg/0.1 mL Take as needed for overdose 12/09/20   Concha Se, MD  ondansetron (ZOFRAN ODT) 4 MG disintegrating tablet Take 1 tablet (4 mg total) by mouth every 8 (eight) hours as needed for nausea or vomiting. 12/09/20   Concha Se, MD  potassium chloride (KLOR-CON) 10 MEQ tablet Take 2 tablets (20 mEq total) by mouth daily for 4 days. 12/09/20 12/13/20  Concha Se, MD  SPRINTEC 28 0.25-35 MG-MCG tablet Take 1 tablet by mouth once daily 11/13/20   Matt Holmes, PA  budesonide-formoterol [facility]) 80-4.5 MCG/ACT inhaler Inhale 2 puffs into the lungs 2 (two) times daily. Patient not taking: Reported on 07/13/2020 08/09/18 08/08/20  Tommie Sams, DO  norethindrone (JENCYCLA) 0.35 MG tablet Take 1 tablet by mouth daily.  05/23/19  [provider]    Physical Exam: Vitals:   04/05/21 0530 04/05/21 0730 04/05/21 0800 04/05/21 0830  BP: (!) 90/57 (!) 101/57 107/68 112/67  Pulse: 94 94 97 100  Resp: (!) 21 (!) 33 (!) 23 (!) 22  Temp:      TempSrc:      SpO2: 100% 99% 98% 97%  Weight:      Height:         Vitals:   04/05/21 0530 04/05/21 0730 04/05/21 0800 04/05/21 0830  BP: (!) 90/57 (!) 101/57 107/68 112/67  Pulse: 94 94 97 100  Resp: (!) 21 (!) 33 (!) 23 (!) 22  Temp:      TempSrc:      SpO2: 100% 99% 98% 97%  Weight:      Height:          Constitutional: Lethargic but awakens to verbal stimuli.  Oriented x 3 while awake not in any apparent distress HEENT:      Head: Normocephalic and atraumatic.         Eyes: PERLA,  EOMI, Conjunctivae are normal. Sclera is non-icteric.       Mouth/Throat: Mucous membranes are moist.       Neck: Supple with no signs of meningismus. Cardiovascular: Regular rate and rhythm. No murmurs, gallops, or rubs. 2+ symmetrical distal pulses are present . No JVD. No LE edema Respiratory: Tachypnea.scattered rhonchi and wheezes at both lung bases.   Gastrointestinal: Soft, non tender, and non distended with positive bowel sounds.  Genitourinary: No CVA tenderness. Musculoskeletal: Nontender with normal range of motion in all extremities. No cyanosis, or erythema of extremities. Neurologic:  Face is symmetric. Moving all extremities. No gross focal neurologic deficits . Skin: Skin is warm, dry.  No rash or ulcers Psychiatric: Mood and  affect are normal    Labs on Admission: I have personally reviewed following labs and imaging studies  CBC: Recent Labs  Lab 04/05/21 0311  WBC 14.1*  NEUTROABS 6.8  HGB 13.8  HCT 42.0  MCV 90.5  PLT 283   Basic Metabolic Panel: Recent Labs  Lab 04/05/21 0311  NA 136  K 4.1  CL 106  CO2 23  GLUCOSE 258*  BUN 7  CREATININE 1.01*  CALCIUM 8.1*   GFR: Estimated Creatinine Clearance: 118.1 mL/min (A) (by C-G formula based on SCr of 1.01 mg/dL (H)). Liver Function Tests: Recent Labs  Lab 04/05/21 0311  AST 42*  ALT 25  ALKPHOS 84  BILITOT 0.6  PROT 7.1  ALBUMIN 3.2*   Recent Labs  Lab 04/05/21 0311  LIPASE 29   No results for input(s): AMMONIA in the last 168 hours. Coagulation Profile: No results for input(s): INR, PROTIME in the last 168 hours. Cardiac Enzymes: No results for input(s): CKTOTAL, CKMB, CKMBINDEX, TROPONINI in the last 168 hours. BNP (last 3 results) No results for input(s): PROBNP in the last 8760 hours. HbA1C: No results for input(s): HGBA1C in the last 72 hours. CBG: No results for input(s): GLUCAP in the last 168 hours. Lipid Profile: No results for input(s): CHOL, HDL, LDLCALC, TRIG, CHOLHDL,  LDLDIRECT in the last 72 hours. Thyroid Function Tests: No results for input(s): TSH, T4TOTAL, FREET4, T3FREE, THYROIDAB in the last 72 hours. Anemia Panel: No results for input(s): VITAMINB12, FOLATE, FERRITIN, TIBC, IRON, RETICCTPCT in the last 72 hours. Urine analysis:    Component Value Date/Time   COLORURINE YELLOW (A) 04/05/2021 0936   APPEARANCEUR CLEAR (A) 04/05/2021 0936   APPEARANCEUR Clear 12/13/2013 2311   LABSPEC 1.038 (H) 04/05/2021 0936   LABSPEC 1.013 12/13/2013 2311   PHURINE 6.0 04/05/2021 0936   GLUCOSEU 50 (A) 04/05/2021 0936   GLUCOSEU Negative 12/13/2013 2311   HGBUR NEGATIVE 04/05/2021 0936   BILIRUBINUR NEGATIVE 04/05/2021 0936   BILIRUBINUR Negative 12/13/2013 2311   KETONESUR NEGATIVE 04/05/2021 0936   PROTEINUR NEGATIVE 04/05/2021 0936   NITRITE NEGATIVE 04/05/2021 0936   LEUKOCYTESUR NEGATIVE 04/05/2021 0936   LEUKOCYTESUR Negative 12/13/2013 2311    Radiological Exams on Admission: CT Angio Chest PE W/Cm &/Or Wo Cm  Result Date: 04/05/2021 CLINICAL DATA:  25 year old female with history of hypoxia. Drug overdose. Elevated D-dimer. EXAM: CT ANGIOGRAPHY CHEST WITH CONTRAST TECHNIQUE: Multidetector CT imaging of the chest was performed using the standard protocol during bolus administration of intravenous contrast. Multiplanar CT image reconstructions and MIPs were obtained to evaluate the vascular anatomy. CONTRAST:  37mL OMNIPAQUE IOHEXOL 350 MG/ML SOLN COMPARISON:  CT the chest, abdomen and pelvis 08/02/2012. FINDINGS: Cardiovascular: Study is limited by considerable patient respiratory motion. With these limitations in mind, there is no central, lobar or proximal segmental sized pulmonary filling defect to suggest pulmonary embolus. More distal segmental and subsegmental sized emboli cannot be entirely excluded secondary to respiratory motion. Heart size is normal. There is no significant pericardial fluid, thickening or pericardial calcification. No  atherosclerotic calcifications in the thoracic aorta or the coronary arteries. Mediastinum/Nodes: No pathologically enlarged mediastinal or hilar lymph nodes. Esophagus is unremarkable in appearance. No axillary lymphadenopathy. Lungs/Pleura: Patchy areas of airspace consolidation and ground-glass attenuation are noted throughout the lungs bilaterally, most severe in the right upper and lower lobes, likely sequela of severe aspiration with developing multilobar bronchopneumonia. No pleural effusions. No definite suspicious appearing pulmonary nodules or masses are noted. Upper Abdomen: Unremarkable. Musculoskeletal: There are  no aggressive appearing lytic or blastic lesions noted in the visualized portions of the skeleton. Review of the MIP images confirms the above findings. IMPRESSION: 1. Limited study demonstrating no evidence of central, lobar or proximal segmental sized pulmonary embolism. 2. Severe multilobar bilateral bronchopneumonia versus chemical pneumonitis, likely sequela of massive aspiration in the setting of known drug overdose. Electronically Signed   By: Trudie Reed M.D.   On: 04/05/2021 06:44   DG Chest Port 1 View  Result Date: 04/05/2021 CLINICAL DATA:  Shortness of breath. Overdose. EXAM: PORTABLE CHEST 1 VIEW COMPARISON:  Chest radiograph dated 12/09/2020. FINDINGS: No focal consolidation, pleural effusion, pneumothorax. The cardiac silhouette is within limits. No acute osseous pathology. IMPRESSION: No active disease. Electronically Signed   By: Elgie Collard M.D.   On: 04/05/2021 03:30     Assessment/Plan Principal Problem:   Aspiration pneumonia (HCC) Active Problems:   Obesity, Class III, BMI 40-49.9 (morbid obesity) (HCC)   Asthma   Nicotine dependence   Acute respiratory failure (HCC)   History of substance abuse (HCC)   Overdose, accidental or unintentional, initial encounter     Acute respiratory failure secondary to aspiration pneumonia Patient was found  unresponsive following an unintentional drug overdose and appears to have aspirated. On room air at rest she was tachypneic with pulse oximetry of 86% and is currently on 6 L of oxygen with pulse oximetry greater than 92%. CT angiogram shows findings suggestive of aspiration pneumonia Will continue Unasyn initiated in the emergency room Will attempt to wean patient off oxygen once her acute illness improves    Unintentional drug overdose Patient has a history of substance abuse and states that she took a pill which she thought was "oxy" and did not know it was fentanyl. She was found unresponsive and received a total of 4 mg of intranasal Narcan with improvement in her mental status. She has had a prior unintentional overdose in the past. Patient will benefit from referral to rehab for substance abuse upon discharge    Morbid obesity (BMI 49.89 kg/m2) Complicates overall prognosis and care    Nicotine dependence Smoking cessation was discussed with patient in detail Will place patient on a nicotine transdermal patch 14 mg daily    History of asthma Place patient on as needed bronchodilator therapy    Hyperglycemia without history of diabetes mellitus Patient's fasting blood glucose was elevated and may be stress related She does not have a known history of diabetes mellitus Will obtain hemoglobin A1c level Check blood sugars with meals  DVT prophylaxis: Lovenox  Code Status: full code  Family Communication: Greater than 50% of time was spent discussing patient's condition and plan of care with her at the bedside.  All questions and concerns have been addressed.  She verbalizes understanding and agrees with the plan. Disposition Plan: Back to previous home environment Consults called: none  Status: At the time of admission, it appears that the appropriate admission status for this patient is inpatient. This is judged to be reasonable and necessary in order to provide the  required intensity of service to ensure the patient's safety given the presenting symptoms, physical exam findings, and initial radiographic and laboratory data in the context of their comorbid conditions. Patient requires inpatient status due to high intensity of service, high risk for further deterioration and high frequency of surveillance required.    Lucile Shutters MD [facility]     04/05/2021, 10:38 AM

## 2021-04-05 NOTE — ED Notes (Signed)
Pt family at bedside

## 2021-04-05 NOTE — ED Notes (Addendum)
MD messaged in regard to pt's BP trending down. No new orders at this time.

## 2021-04-05 NOTE — ED Notes (Signed)
MD messaged for PRN order for antiemetic

## 2021-04-05 NOTE — ED Notes (Signed)
Attempted to obtain 2nd set of labs, unsuccessful, phlebotomist called

## 2021-04-06 DIAGNOSIS — F17213 Nicotine dependence, cigarettes, with withdrawal: Secondary | ICD-10-CM | POA: Diagnosis not present

## 2021-04-06 DIAGNOSIS — J690 Pneumonitis due to inhalation of food and vomit: Secondary | ICD-10-CM | POA: Diagnosis not present

## 2021-04-06 DIAGNOSIS — J9600 Acute respiratory failure, unspecified whether with hypoxia or hypercapnia: Secondary | ICD-10-CM | POA: Diagnosis not present

## 2021-04-06 LAB — BASIC METABOLIC PANEL
Anion gap: 3 — ABNORMAL LOW (ref 5–15)
BUN: <5 mg/dL — ABNORMAL LOW (ref 6–20)
CO2: 29 mmol/L (ref 22–32)
Calcium: 8.3 mg/dL — ABNORMAL LOW (ref 8.9–10.3)
Chloride: 106 mmol/L (ref 98–111)
Creatinine, Ser: 0.65 mg/dL (ref 0.44–1.00)
GFR, Estimated: >60 mL/min (ref >60)
Glucose, Bld: 89 mg/dL (ref 70–99)
Potassium: 4.4 mmol/L (ref 3.5–5.1)
Sodium: 138 mmol/L (ref 135–145)

## 2021-04-06 LAB — CBC
HCT: 34.7 % — ABNORMAL LOW (ref 36.0–46.0)
Hemoglobin: 11.2 g/dL — ABNORMAL LOW (ref 12.0–15.0)
MCH: 28.7 pg (ref 26.0–34.0)
MCHC: 32.3 g/dL (ref 30.0–36.0)
MCV: 89 fL (ref 80.0–100.0)
Platelets: 197 10*3/uL (ref 150–400)
RBC: 3.9 MIL/uL (ref 3.87–5.11)
RDW: 13 % (ref 11.5–15.5)
WBC: 8.8 10*3/uL (ref 4.0–10.5)
nRBC: 0 % (ref 0.0–0.2)

## 2021-04-06 LAB — GLUCOSE, CAPILLARY
Glucose-Capillary: 105 mg/dL — ABNORMAL HIGH (ref 70–99)
Glucose-Capillary: 99 mg/dL (ref 70–99)
Glucose-Capillary: 92 mg/dL (ref 70–99)

## 2021-04-06 LAB — HIV ANTIBODY (ROUTINE TESTING W REFLEX): HIV Screen 4th Generation wRfx: NONREACTIVE

## 2021-04-06 MED ORDER — IBUPROFEN 400 MG PO TABS
400.0000 mg | ORAL_TABLET | Freq: Four times a day (QID) | ORAL | Status: DC | PRN
  Administered 2021-04-06: 400 mg via ORAL
  Filled 2021-04-06: qty 1

## 2021-04-06 NOTE — Progress Notes (Signed)
Upon admission to the floor, pt's belongings searched due to some concerns raised by ED nurse about a visitor.  Nothing found.  Search conducted with [employer].  Pt explained that search was for her safety and to ensure that pt did not consume harmful substances while in the hospital.  Pt consented and verbalized understanding.

## 2021-04-06 NOTE — Progress Notes (Signed)
PROGRESS NOTE    Susan Swanson  SWH:[id number] DOB: 04-22-1996 DOA: 04/05/2021 PCP: Patient, No Pcp Per (Inactive)    Brief Narrative:  Susan Swanson is a 25 y.o. female with medical history significant for substance abuse, anxiety disorder, asthma who was brought into the ER by EMS for evaluation of an accidental fentanyl overdose. she was awake and was actively vomiting.  She had room air pulse oximetry of 86% upon her arrival and is currently on 6 L of oxygen to maintain pulse oximetry greater than 92%.    Consultants:    Procedures:  CT chest 1. Limited study demonstrating no evidence of central, lobar or proximal segmental sized pulmonary embolism. 2. Severe multilobar bilateral bronchopneumonia versus chemical pneumonitis, likely sequela of massive aspiration in the setting of known drug overdose.   Antimicrobials:  unasyn    Subjective: Barely opens eyes for me, reports sleepy, at times snoring.   Objective: Vitals:   04/05/21 1900 04/05/21 2114 04/06/21 0532 04/06/21 0753  BP: 118/72 100/61 104/67 119/67  Pulse: 79 77 80 89  Resp: 18 16 18 18   Temp: 97.7 F (36.5 C) 97.8 F (36.6 C) 97.7 F (36.5 C) 99.1 F (37.3 C)  TempSrc: Oral Oral Oral Oral  SpO2: 100% 100% 100% 100%  Weight:      Height:        Intake/Output Summary (Last 24 hours) at 04/06/2021 0819 Last data filed at 04/05/2021 0935 Gross per 24 hour  Intake --  Output 300 ml  Net -300 ml   Filed Weights   04/05/21 0309  Weight: 136 kg    Examination:  General exam: Appears calm and comfortable , snoring at times, sleepy Respiratory system: mild rhonchi, no wheezing Cardiovascular system: S1 & S2 heard, RRR. No gallop Gastrointestinal system: Abdomen is nondistended, soft and nontender.  Normal bowel sounds heard. Central nervous system: unable to assess Extremities: no edema Skin: warm, dry     Data Reviewed: I have personally reviewed following labs and imaging  studies  CBC: Recent Labs  Lab 04/05/21 0311 04/06/21 0512  WBC 14.1* 8.8  NEUTROABS 6.8  --   HGB 13.8 11.2*  HCT 42.0 34.7*  MCV 90.5 89.0  PLT 283 197   Basic Metabolic Panel: Recent Labs  Lab 04/05/21 0311 04/06/21 0512  NA 136 138  K 4.1 4.4  CL 106 106  CO2 23 29  GLUCOSE 258* 89  BUN 7 <5*  CREATININE 1.01* 0.65  CALCIUM 8.1* 8.3*   GFR: Estimated Creatinine Clearance: 149.1 mL/min (by C-G formula based on SCr of 0.65 mg/dL). Liver Function Tests: Recent Labs  Lab 04/05/21 0311  AST 42*  ALT 25  ALKPHOS 84  BILITOT 0.6  PROT 7.1  ALBUMIN 3.2*   Recent Labs  Lab 04/05/21 0311  LIPASE 29   No results for input(s): AMMONIA in the last 168 hours. Coagulation Profile: No results for input(s): INR, PROTIME in the last 168 hours. Cardiac Enzymes: No results for input(s): CKTOTAL, CKMB, CKMBINDEX, TROPONINI in the last 168 hours. BNP (last 3 results) No results for input(s): PROBNP in the last 8760 hours. HbA1C: Recent Labs    04/05/21 0714  HGBA1C 5.1   CBG: Recent Labs  Lab 04/05/21 1156 04/05/21 1802 04/06/21 0755  GLUCAP 101* 97 105*   Lipid Profile: No results for input(s): CHOL, HDL, LDLCALC, TRIG, CHOLHDL, LDLDIRECT in the last 72 hours. Thyroid Function Tests: No results for input(s): TSH, T4TOTAL, FREET4, T3FREE, THYROIDAB in the  last 72 hours. Anemia Panel: No results for input(s): VITAMINB12, FOLATE, FERRITIN, TIBC, IRON, RETICCTPCT in the last 72 hours. Sepsis Labs: Recent Labs  Lab 04/05/21 6269  LATICACIDVEN 1.7    Recent Results (from the past 240 hour(s))  Resp Panel by RT-PCR (Flu A&B, Covid) Nasopharyngeal Swab     Status: None   Collection Time: 04/05/21  3:12 AM   Specimen: Nasopharyngeal Swab; Nasopharyngeal(NP) swabs in vial transport medium  Result Value Ref Range Status   SARS Coronavirus 2 by RT PCR NEGATIVE NEGATIVE Final    Comment: (NOTE) SARS-CoV-2 target nucleic acids are NOT DETECTED.  The  SARS-CoV-2 RNA is generally detectable in upper respiratory specimens during the acute phase of infection. The lowest concentration of SARS-CoV-2 viral copies this assay can detect is 138 copies/mL. A negative result does not preclude SARS-Cov-2 infection and should not be used as the sole basis for treatment or other patient management decisions. A negative result may occur with  improper specimen collection/handling, submission of specimen other than nasopharyngeal swab, presence of viral mutation(s) within the areas targeted by this assay, and inadequate number of viral copies(<138 copies/mL). A negative result must be combined with clinical observations, patient history, and epidemiological information. The expected result is Negative.  Fact Sheet for Patients:  BloggerCourse.com  Fact Sheet for Healthcare Providers:  SeriousBroker.it  This test is no t yet approved or cleared by the [place] FDA and  has been authorized for detection and/or diagnosis of SARS-CoV-2 by FDA under an Emergency Use Authorization (EUA). This EUA will remain  in effect (meaning this test can be used) for the duration of the COVID-19 declaration under Section 564(b)(1) of the Act, 21 U.S.C.section 360bbb-3(b)(1), unless the authorization is terminated  or revoked sooner.       Influenza A by PCR NEGATIVE NEGATIVE Final   Influenza B by PCR NEGATIVE NEGATIVE Final    Comment: (NOTE) The Xpert Xpress SARS-CoV-2/FLU/RSV plus assay is intended as an aid in the diagnosis of influenza from Nasopharyngeal swab specimens and should not be used as a sole basis for treatment. Nasal washings and aspirates are unacceptable for Xpert Xpress SARS-CoV-2/FLU/RSV testing.  Fact Sheet for Patients: BloggerCourse.com  Fact Sheet for Healthcare Providers: SeriousBroker.it  This test is not yet approved or  cleared by the [place] FDA and has been authorized for detection and/or diagnosis of SARS-CoV-2 by FDA under an Emergency Use Authorization (EUA). This EUA will remain in effect (meaning this test can be used) for the duration of the COVID-19 declaration under Section 564(b)(1) of the Act, 21 U.S.C. section 360bbb-3(b)(1), unless the authorization is terminated or revoked.  Performed at [facility], [address], [place], [place] [address]          Radiology Studies: CT Angio Chest PE W/Cm &/Or Wo Cm  Result Date: 04/05/2021 CLINICAL DATA:  25 year old female with history of hypoxia. Drug overdose. Elevated D-dimer. EXAM: CT ANGIOGRAPHY CHEST WITH CONTRAST TECHNIQUE: Multidetector CT imaging of the chest was performed using the standard protocol during bolus administration of intravenous contrast. Multiplanar CT image reconstructions and MIPs were obtained to evaluate the vascular anatomy. CONTRAST:  62mL OMNIPAQUE IOHEXOL 350 MG/ML SOLN COMPARISON:  CT the chest, abdomen and pelvis 08/02/2012. FINDINGS: Cardiovascular: Study is limited by considerable patient respiratory motion. With these limitations in mind, there is no central, lobar or proximal segmental sized pulmonary filling defect to suggest pulmonary embolus. More distal segmental and subsegmental sized emboli cannot be entirely excluded secondary  to respiratory motion. Heart size is normal. There is no significant pericardial fluid, thickening or pericardial calcification. No atherosclerotic calcifications in the thoracic aorta or the coronary arteries. Mediastinum/Nodes: No pathologically enlarged mediastinal or hilar lymph nodes. Esophagus is unremarkable in appearance. No axillary lymphadenopathy. Lungs/Pleura: Patchy areas of airspace consolidation and ground-glass attenuation are noted throughout the lungs bilaterally, most severe in the right upper and lower lobes, likely sequela of severe aspiration with  developing multilobar bronchopneumonia. No pleural effusions. No definite suspicious appearing pulmonary nodules or masses are noted. Upper Abdomen: Unremarkable. Musculoskeletal: There are no aggressive appearing lytic or blastic lesions noted in the visualized portions of the skeleton. Review of the MIP images confirms the above findings. IMPRESSION: 1. Limited study demonstrating no evidence of central, lobar or proximal segmental sized pulmonary embolism. 2. Severe multilobar bilateral bronchopneumonia versus chemical pneumonitis, likely sequela of massive aspiration in the setting of known drug overdose. Electronically Signed   By: Trudie Reed M.D.   On: 04/05/2021 06:44   DG Chest Port 1 View  Result Date: 04/05/2021 CLINICAL DATA:  Shortness of breath. Overdose. EXAM: PORTABLE CHEST 1 VIEW COMPARISON:  Chest radiograph dated 12/09/2020. FINDINGS: No focal consolidation, pleural effusion, pneumothorax. The cardiac silhouette is within limits. No acute osseous pathology. IMPRESSION: No active disease. Electronically Signed   By: Elgie Collard M.D.   On: 04/05/2021 03:30        Scheduled Meds:  enoxaparin (LOVENOX) injection  0.5 mg/kg Subcutaneous Q24H   nicotine  14 mg Transdermal Daily   norgestimate-ethinyl estradiol  1 tablet Oral Daily   sodium chloride flush  3 mL Intravenous Q12H   Continuous Infusions:  sodium chloride     sodium chloride 100 mL/hr at 04/05/21 1155   ampicillin-sulbactam (UNASYN) IV 3 g (04/06/21 0434)    Assessment & Plan:   Principal Problem:   Aspiration pneumonia (HCC) Active Problems:   Obesity, Class III, BMI 40-49.9 (morbid obesity) (HCC)   Asthma   Nicotine dependence   Acute respiratory failure (HCC)   History of substance abuse (HCC)   Overdose, accidental or unintentional, initial encounter   Hyperglycemia   Acute respiratory failure secondary to aspiration pneumonia Patient was found unresponsive following an unintentional drug  overdose and appears to have aspirated. Continue iv abx, wean down 02        Unintentional drug overdose Took fentanyl +cocaine, benzo, cannabinoid on toxicology screen also       Morbid obesity (BMI 49.89 kg/m2) Complicates overall prognosis and care       Nicotine dependence Cessation was discussed during this hospitalization  Continue nicotine transdermal patch      History of asthma Continue inhalers        Hyperglycemia without history of diabetes mellitus Patient's fasting blood glucose was elevated and may be stress related BG stable A1c 5.1       DVT prophylaxis: Lovenox Code Status: Full Family Communication: None at bedside Disposition Plan:  Status is: Inpatient  Remains inpatient appropriate because:Inpatient level of care appropriate due to severity of illness  Dispo: The patient is from: Home              Anticipated d/c is to: Home              Patient currently is not medically stable to d/c.   Difficult to place patient No            LOS: 1 day   Time spent: with more than  50% on COC    Lynn ItoSahar Satvik Parco, MD [facility] Pager [phone]  If 7PM-7AM, please contact night-coverage 04/06/2021, 8:19 AM

## 2021-04-06 NOTE — Plan of Care (Signed)
  Problem: Education: Goal: Knowledge of General Education information will improve Description: Including pain rating scale, medication(s)/side effects and non-pharmacologic comfort measures 04/06/2021 1540 by Ansel Bong, RN Outcome: Progressing 04/06/2021 1540 by Ansel Bong, RN Outcome: Progressing   Problem: Health Behavior/Discharge Planning: Goal: Ability to manage health-related needs will improve 04/06/2021 1540 by Ansel Bong, RN Outcome: Progressing 04/06/2021 1540 by Ansel Bong, RN Outcome: Progressing   Problem: Clinical Measurements: Goal: Ability to maintain clinical measurements within normal limits will improve 04/06/2021 1540 by Ansel Bong, RN Outcome: Progressing 04/06/2021 1540 by Ansel Bong, RN Outcome: Progressing Goal: Will remain free from infection 04/06/2021 1540 by Ansel Bong, RN Outcome: Progressing 04/06/2021 1540 by Ansel Bong, RN Outcome: Progressing Goal: Diagnostic test results will improve 04/06/2021 1540 by Ansel Bong, RN Outcome: Progressing 04/06/2021 1540 by Ansel Bong, RN Outcome: Progressing Goal: Respiratory complications will improve 04/06/2021 1540 by Ansel Bong, RN Outcome: Progressing 04/06/2021 1540 by Ansel Bong, RN Outcome: Progressing Goal: Cardiovascular complication will be avoided 04/06/2021 1540 by Ansel Bong, RN Outcome: Progressing 04/06/2021 1540 by Ansel Bong, RN Outcome: Progressing   Problem: Activity: Goal: Risk for activity intolerance will decrease 04/06/2021 1540 by Ansel Bong, RN Outcome: Progressing 04/06/2021 1540 by Ansel Bong, RN Outcome: Progressing   Problem: Nutrition: Goal: Adequate nutrition will be maintained 04/06/2021 1540 by Ansel Bong, RN Outcome: Progressing 04/06/2021 1540 by Ansel Bong, RN Outcome: Progressing   Problem: Coping: Goal: Level of anxiety will decrease 04/06/2021 1540 by Ansel Bong, RN Outcome: Progressing 04/06/2021 1540 by  Ansel Bong, RN Outcome: Progressing   Problem: Elimination: Goal: Will not experience complications related to bowel motility 04/06/2021 1540 by Ansel Bong, RN Outcome: Progressing 04/06/2021 1540 by Ansel Bong, RN Outcome: Progressing Goal: Will not experience complications related to urinary retention 04/06/2021 1540 by Ansel Bong, RN Outcome: Progressing 04/06/2021 1540 by Ansel Bong, RN Outcome: Progressing   Problem: Pain Managment: Goal: General experience of comfort will improve 04/06/2021 1540 by Ansel Bong, RN Outcome: Progressing 04/06/2021 1540 by Ansel Bong, RN Outcome: Progressing   Problem: Safety: Goal: Ability to remain free from injury will improve 04/06/2021 1540 by Ansel Bong, RN Outcome: Progressing 04/06/2021 1540 by Ansel Bong, RN Outcome: Progressing   Problem: Skin Integrity: Goal: Risk for impaired skin integrity will decrease 04/06/2021 1540 by Ansel Bong, RN Outcome: Progressing 04/06/2021 1540 by Ansel Bong, RN Outcome: Progressing

## 2021-04-07 DIAGNOSIS — J9600 Acute respiratory failure, unspecified whether with hypoxia or hypercapnia: Secondary | ICD-10-CM | POA: Diagnosis not present

## 2021-04-07 DIAGNOSIS — J690 Pneumonitis due to inhalation of food and vomit: Secondary | ICD-10-CM | POA: Diagnosis not present

## 2021-04-07 DIAGNOSIS — F17213 Nicotine dependence, cigarettes, with withdrawal: Secondary | ICD-10-CM | POA: Diagnosis not present

## 2021-04-07 LAB — GLUCOSE, CAPILLARY
Glucose-Capillary: 91 mg/dL (ref 70–99)
Glucose-Capillary: 95 mg/dL (ref 70–99)
Glucose-Capillary: 93 mg/dL (ref 70–99)

## 2021-04-07 MED ORDER — AMOXICILLIN-POT CLAVULANATE 875-125 MG PO TABS: 1.0000 | ORAL_TABLET | Freq: Two times a day (BID) | ORAL | 0 refills | Status: AC

## 2021-04-07 MED ORDER — AMOXICILLIN-POT CLAVULANATE 875-125 MG PO TABS: 1.0000 | ORAL_TABLET | Freq: Two times a day (BID) | ORAL | Status: DC

## 2021-04-07 NOTE — Plan of Care (Signed)
  Problem: Education: Goal: Knowledge of General Education information will improve Description: Including pain rating scale, medication(s)/side effects and non-pharmacologic comfort measures 04/07/2021 1130 by Ansel Bong, RN Outcome: Progressing 04/07/2021 1130 by Ansel Bong, RN Outcome: Progressing   Problem: Health Behavior/Discharge Planning: Goal: Ability to manage health-related needs will improve 04/07/2021 1130 by Ansel Bong, RN Outcome: Progressing 04/07/2021 1130 by Ansel Bong, RN Outcome: Progressing   Problem: Clinical Measurements: Goal: Ability to maintain clinical measurements within normal limits will improve 04/07/2021 1130 by Ansel Bong, RN Outcome: Progressing 04/07/2021 1130 by Ansel Bong, RN Outcome: Progressing Goal: Will remain free from infection 04/07/2021 1130 by Ansel Bong, RN Outcome: Progressing 04/07/2021 1130 by Ansel Bong, RN Outcome: Progressing Goal: Diagnostic test results will improve 04/07/2021 1130 by Ansel Bong, RN Outcome: Progressing 04/07/2021 1130 by Ansel Bong, RN Outcome: Progressing Goal: Respiratory complications will improve 04/07/2021 1130 by Ansel Bong, RN Outcome: Progressing 04/07/2021 1130 by Ansel Bong, RN Outcome: Progressing Goal: Cardiovascular complication will be avoided 04/07/2021 1130 by Ansel Bong, RN Outcome: Progressing 04/07/2021 1130 by Ansel Bong, RN Outcome: Progressing   Problem: Activity: Goal: Risk for activity intolerance will decrease 04/07/2021 1130 by Ansel Bong, RN Outcome: Progressing 04/07/2021 1130 by Ansel Bong, RN Outcome: Progressing   Problem: Nutrition: Goal: Adequate nutrition will be maintained 04/07/2021 1130 by Ansel Bong, RN Outcome: Progressing 04/07/2021 1130 by Ansel Bong, RN Outcome: Progressing   Problem: Coping: Goal: Level of anxiety will decrease 04/07/2021 1130 by Ansel Bong, RN Outcome: Progressing 04/07/2021 1130 by  Ansel Bong, RN Outcome: Progressing   Problem: Elimination: Goal: Will not experience complications related to bowel motility 04/07/2021 1130 by Ansel Bong, RN Outcome: Progressing 04/07/2021 1130 by Ansel Bong, RN Outcome: Progressing Goal: Will not experience complications related to urinary retention 04/07/2021 1130 by Ansel Bong, RN Outcome: Progressing 04/07/2021 1130 by Ansel Bong, RN Outcome: Progressing   Problem: Pain Managment: Goal: General experience of comfort will improve 04/07/2021 1130 by Ansel Bong, RN Outcome: Progressing 04/07/2021 1130 by Ansel Bong, RN Outcome: Progressing   Problem: Safety: Goal: Ability to remain free from injury will improve 04/07/2021 1130 by Ansel Bong, RN Outcome: Progressing 04/07/2021 1130 by Ansel Bong, RN Outcome: Progressing   Problem: Skin Integrity: Goal: Risk for impaired skin integrity will decrease 04/07/2021 1130 by Ansel Bong, RN Outcome: Progressing 04/07/2021 1130 by Ansel Bong, RN Outcome: Progressing

## 2021-04-07 NOTE — Progress Notes (Signed)
This RN provided discharge instructions and teaching to the patient. The patient verbalized and demonstrated understanding of the provided instructions. All outstanding questions resolved. L arm PIV removed. Cannula intact. Pt tolerated fairly well. All belongings packed and in tow. Volunteer services to transport patient to private vehicle via wheelchair at time of departure.

## 2021-04-07 NOTE — Discharge Summary (Signed)
Susan Swanson DTH:[id number] DOB: Nov 28, 1995 DOA: 04/05/2021  PCP: Patient, No Pcp Per (Inactive)  Admit date: 04/05/2021 Discharge date: 04/07/2021  Admitted From: home Disposition:  home  Recommendations for Outpatient Follow-up:  Follow up with PCP in 1 week Please obtain BMP/CBC in one week      Discharge Condition:Stable CODE STATUS:full  Diet recommendation: low fat diet  Brief/Interim Summary: Susan Swanson is a 25 y.o. female with medical history significant for substance abuse, anxiety disorder, asthma who was brought into the ER by EMS for evaluation of an accidental fentanyl overdose. Per EMS patient was found unresponsive, agonal with pinpoint pupils and a GCS of 3.  She received 2 mg intranasal Narcan and remained unresponsive by the time EMS arrived.  She received an additional 2 mg intranasal Narcan by EMS with improvement in her GCS and by the time she arrived to ER she was awake and was actively vomiting.  She had room air pulse oximetry of 86% upon her arrival and was on 6 L of oxygen .Patient stated that she took a pill which she thought was "oxy" and did not know it was fentanyl. She admits to a prior unintentional overdose with fentanyl in the past.  She denied  having any suicidal or homicidal ideations.  CT of the chest was obtained revealing possible bronchopneumonia versus chemical pneumonitis likely sequela of massive aspiration in setting of known drug overdose.    She was treated for aspiration pneumonia with IV antibiotics.  She has had no symptoms of withdrawal today.  She feels well.  And stable to go home.   Acute respiratory failure secondary to aspiration pneumonia Patient was found unresponsive following an unintentional drug overdose and appears to have aspirated. Treated for aspiration pneumonia with IV antibiotics, will switch to p.o. to complete course          Unintentional drug overdose Took fentanyl +cocaine, benzo, cannabinoid on  toxicology screen  Counseled during hospitalization to stop drug abuse, will need to seek help as outpatient.  Unsure if patient is interested in this       Morbid obesity (BMI 72.82 kg/m2) Complicates overall prognosis and care       Nicotine dependence Cessation was discussed during this hospitalization          History of asthma Without acute exacerbation       Hyperglycemia without history of diabetes mellitus Patient's fasting blood glucose was elevated and may be stress related BG stable A1c 5.1 Follow-up with PCP as outpatient      Discharge Diagnoses:  Principal Problem:   Aspiration pneumonia ([place]) Active Problems:   Obesity, Class III, BMI 40-49.9 (morbid obesity) ([place])   Asthma   Nicotine dependence   Acute respiratory failure ([place])   History of substance abuse ([place])   Overdose, accidental or unintentional, initial encounter   Hyperglycemia    Discharge Instructions  Discharge Instructions     Call MD for:  difficulty breathing, headache or visual disturbances   Complete by: As directed    Diet - low sodium heart healthy   Complete by: As directed    Discharge instructions   Complete by: As directed    F/u with pcp   Increase activity slowly   Complete by: As directed       Allergies as of 04/07/2021       Reactions   Doxycycline    Other reaction(s): dizzy        Medication List  TAKE these medications    albuterol 108 (90 Base) MCG/ACT inhaler Commonly known as: VENTOLIN HFA Inhale 1-2 puffs into the lungs every 6 (six) hours as needed for wheezing or shortness of breath.   amoxicillin-clavulanate 875-125 MG tablet Commonly known as: AUGMENTIN Take 1 tablet by mouth every 12 (twelve) hours for 4 days.   naloxone 4 MG/0.1ML Liqd nasal spray kit Commonly known as: NARCAN Take as needed for overdose   Sprintec 28 0.25-35 MG-MCG tablet Generic drug: norgestimate-ethinyl estradiol Take 1 tablet by mouth once daily         Allergies  Allergen Reactions   Doxycycline     Other reaction(s): dizzy    Consultations:    Procedures/Studies: CT Angio Chest PE W/Cm &/Or Wo Cm  Result Date: 04/05/2021 CLINICAL DATA:  25 year old female with history of hypoxia. Drug overdose. Elevated D-dimer. EXAM: CT ANGIOGRAPHY CHEST WITH CONTRAST TECHNIQUE: Multidetector CT imaging of the chest was performed using the standard protocol during bolus administration of intravenous contrast. Multiplanar CT image reconstructions and MIPs were obtained to evaluate the vascular anatomy. CONTRAST:  55m OMNIPAQUE IOHEXOL 350 MG/ML SOLN COMPARISON:  CT the chest, abdomen and pelvis 08/02/2012. FINDINGS: Cardiovascular: Study is limited by considerable patient respiratory motion. With these limitations in mind, there is no central, lobar or proximal segmental sized pulmonary filling defect to suggest pulmonary embolus. More distal segmental and subsegmental sized emboli cannot be entirely excluded secondary to respiratory motion. Heart size is normal. There is no significant pericardial fluid, thickening or pericardial calcification. No atherosclerotic calcifications in the thoracic aorta or the coronary arteries. Mediastinum/Nodes: No pathologically enlarged mediastinal or hilar lymph nodes. Esophagus is unremarkable in appearance. No axillary lymphadenopathy. Lungs/Pleura: Patchy areas of airspace consolidation and ground-glass attenuation are noted throughout the lungs bilaterally, most severe in the right upper and lower lobes, likely sequela of severe aspiration with developing multilobar bronchopneumonia. No pleural effusions. No definite suspicious appearing pulmonary nodules or masses are noted. Upper Abdomen: Unremarkable. Musculoskeletal: There are no aggressive appearing lytic or blastic lesions noted in the visualized portions of the skeleton. Review of the MIP images confirms the above findings. IMPRESSION: 1. Limited study  demonstrating no evidence of central, lobar or proximal segmental sized pulmonary embolism. 2. Severe multilobar bilateral bronchopneumonia versus chemical pneumonitis, likely sequela of massive aspiration in the setting of known drug overdose. Electronically Signed   By: DVinnie LangtonM.D.   On: 04/05/2021 06:44   DG Chest Port 1 View  Result Date: 04/05/2021 CLINICAL DATA:  Shortness of breath. Overdose. EXAM: PORTABLE CHEST 1 VIEW COMPARISON:  Chest radiograph dated 12/09/2020. FINDINGS: No focal consolidation, pleural effusion, pneumothorax. The cardiac silhouette is within limits. No acute osseous pathology. IMPRESSION: No active disease. Electronically Signed   By: AAnner CreteM.D.   On: 04/05/2021 03:30      Subjective: Has no complaints of nausea, vomiting, chest pain or any other complaints.  She feels better.   Discharge Exam: Vitals:   04/07/21 0754 04/07/21 1147  BP: 117/76 118/80  Pulse: 94 91  Resp: 17 16  Temp: 98.6 F (37 C) 98 F (36.7 C)  SpO2: 97% 96%   Vitals:   04/06/21 1914 04/07/21 0500 04/07/21 0754 04/07/21 1147  BP: 112/62 125/84 117/76 118/80  Pulse: 96 88 94 91  Resp: '18 18 17 16  ' Temp: 97.7 F (36.5 C) 98.3 F (36.8 C) 98.6 F (37 C) 98 F (36.7 C)  TempSrc:      SpO2: 97% 100%  97% 96%  Weight:      Height:        General: Pt is alert, awake, not in acute distress Cardiovascular: RRR, S1/S2 +, no rubs, no gallops Respiratory: CTA bilaterally, no wheezing, no rhonchi Abdominal: Soft, NT, ND, bowel sounds + Extremities: no edema, no cyanosis    The results of significant diagnostics from this hospitalization (including imaging, microbiology, ancillary and laboratory) are listed below for reference.     Microbiology: Recent Results (from the past 240 hour(s))  Resp Panel by RT-PCR (Flu A&B, Covid) Nasopharyngeal Swab     Status: None   Collection Time: 04/05/21  3:12 AM   Specimen: Nasopharyngeal Swab; Nasopharyngeal(NP) swabs in  vial transport medium  Result Value Ref Range Status   SARS Coronavirus 2 by RT PCR NEGATIVE NEGATIVE Final    Comment: (NOTE) SARS-CoV-2 target nucleic acids are NOT DETECTED.  The SARS-CoV-2 RNA is generally detectable in upper respiratory specimens during the acute phase of infection. The lowest concentration of SARS-CoV-2 viral copies this assay can detect is 138 copies/mL. A negative result does not preclude SARS-Cov-2 infection and should not be used as the sole basis for treatment or other patient management decisions. A negative result may occur with  improper specimen collection/handling, submission of specimen other than nasopharyngeal swab, presence of viral mutation(s) within the areas targeted by this assay, and inadequate number of viral copies(<138 copies/mL). A negative result must be combined with clinical observations, patient history, and epidemiological information. The expected result is Negative.  Fact Sheet for Patients:  EntrepreneurPulse.com.au  Fact Sheet for Healthcare Providers:  IncredibleEmployment.be  This test is no t yet approved or cleared by the [place] FDA and  has been authorized for detection and/or diagnosis of SARS-CoV-2 by FDA under an Emergency Use Authorization (EUA). This EUA will remain  in effect (meaning this test can be used) for the duration of the COVID-19 declaration under Section 564(b)(1) of the Act, 21 U.S.C.section 360bbb-3(b)(1), unless the authorization is terminated  or revoked sooner.       Influenza A by PCR NEGATIVE NEGATIVE Final   Influenza B by PCR NEGATIVE NEGATIVE Final    Comment: (NOTE) The Xpert Xpress SARS-CoV-2/FLU/RSV plus assay is intended as an aid in the diagnosis of influenza from Nasopharyngeal swab specimens and should not be used as a sole basis for treatment. Nasal washings and aspirates are unacceptable for Xpert Xpress SARS-CoV-2/FLU/RSV testing.  Fact  Sheet for Patients: EntrepreneurPulse.com.au  Fact Sheet for Healthcare Providers: IncredibleEmployment.be  This test is not yet approved or cleared by the [place] FDA and has been authorized for detection and/or diagnosis of SARS-CoV-2 by FDA under an Emergency Use Authorization (EUA). This EUA will remain in effect (meaning this test can be used) for the duration of the COVID-19 declaration under Section 564(b)(1) of the Act, 21 U.S.C. section 360bbb-3(b)(1), unless the authorization is terminated or revoked.  Performed at [facility], [address] [place], [place] [address]      Labs: BNP (last 3 results) Recent Labs    12/09/20 0130 04/05/21 0311  BNP 32.5 88.4   Basic Metabolic Panel: Recent Labs  Lab 04/05/21 0311 04/06/21 0512  NA 136 138  K 4.1 4.4  CL 106 106  CO2 23 29  GLUCOSE 258* 89  BUN 7 <5*  CREATININE 1.01* 0.65  CALCIUM 8.1* 8.3*   Liver Function Tests: Recent Labs  Lab 04/05/21 0311  AST 42*  ALT 25  ALKPHOS 84  BILITOT 0.6  PROT 7.1  ALBUMIN 3.2*   Recent Labs  Lab 04/05/21 0311  LIPASE 29   No results for input(s): AMMONIA in the last 168 hours. CBC: Recent Labs  Lab 04/05/21 0311 04/06/21 0512  WBC 14.1* 8.8  NEUTROABS 6.8  --   HGB 13.8 11.2*  HCT 42.0 34.7*  MCV 90.5 89.0  PLT 283 197   Cardiac Enzymes: No results for input(s): CKTOTAL, CKMB, CKMBINDEX, TROPONINI in the last 168 hours. BNP: Invalid input(s): POCBNP CBG: Recent Labs  Lab 04/06/21 1114 04/06/21 1623 04/07/21 0622 04/07/21 0757 04/07/21 1150  GLUCAP 99 92 91 95 93   D-Dimer No results for input(s): DDIMER in the last 72 hours. Hgb A1c Recent Labs    04/05/21 0714  HGBA1C 5.1   Lipid Profile No results for input(s): CHOL, HDL, LDLCALC, TRIG, CHOLHDL, LDLDIRECT in the last 72 hours. Thyroid function studies No results for input(s): TSH, T4TOTAL, T3FREE, THYROIDAB in the last 72  hours.  Invalid input(s): FREET3 Anemia work up No results for input(s): VITAMINB12, FOLATE, FERRITIN, TIBC, IRON, RETICCTPCT in the last 72 hours. Urinalysis    Component Value Date/Time   COLORURINE YELLOW (A) 04/05/2021 0936   APPEARANCEUR CLEAR (A) 04/05/2021 0936   APPEARANCEUR Clear 12/13/2013 2311   LABSPEC 1.038 (H) 04/05/2021 0936   LABSPEC 1.013 12/13/2013 2311   PHURINE 6.0 04/05/2021 0936   GLUCOSEU 50 (A) 04/05/2021 0936   GLUCOSEU Negative 12/13/2013 2311   HGBUR NEGATIVE 04/05/2021 0936   BILIRUBINUR NEGATIVE 04/05/2021 0936   BILIRUBINUR Negative 12/13/2013 2311   KETONESUR NEGATIVE 04/05/2021 0936   PROTEINUR NEGATIVE 04/05/2021 0936   NITRITE NEGATIVE 04/05/2021 0936   LEUKOCYTESUR NEGATIVE 04/05/2021 0936   LEUKOCYTESUR Negative 12/13/2013 2311   Sepsis Labs Invalid input(s): PROCALCITONIN,  WBC,  LACTICIDVEN Microbiology Recent Results (from the past 240 hour(s))  Resp Panel by RT-PCR (Flu A&B, Covid) Nasopharyngeal Swab     Status: None   Collection Time: 04/05/21  3:12 AM   Specimen: Nasopharyngeal Swab; Nasopharyngeal(NP) swabs in vial transport medium  Result Value Ref Range Status   SARS Coronavirus 2 by RT PCR NEGATIVE NEGATIVE Final    Comment: (NOTE) SARS-CoV-2 target nucleic acids are NOT DETECTED.  The SARS-CoV-2 RNA is generally detectable in upper respiratory specimens during the acute phase of infection. The lowest concentration of SARS-CoV-2 viral copies this assay can detect is 138 copies/mL. A negative result does not preclude SARS-Cov-2 infection and should not be used as the sole basis for treatment or other patient management decisions. A negative result may occur with  improper specimen collection/handling, submission of specimen other than nasopharyngeal swab, presence of viral mutation(s) within the areas targeted by this assay, and inadequate number of viral copies(<138 copies/mL). A negative result must be combined  with clinical observations, patient history, and epidemiological information. The expected result is Negative.  Fact Sheet for Patients:  EntrepreneurPulse.com.au  Fact Sheet for Healthcare Providers:  IncredibleEmployment.be  This test is no t yet approved or cleared by the [place] FDA and  has been authorized for detection and/or diagnosis of SARS-CoV-2 by FDA under an Emergency Use Authorization (EUA). This EUA will remain  in effect (meaning this test can be used) for the duration of the COVID-19 declaration under Section 564(b)(1) of the Act, 21 U.S.C.section 360bbb-3(b)(1), unless the authorization is terminated  or revoked sooner.       Influenza A by PCR NEGATIVE NEGATIVE Final   Influenza B by PCR NEGATIVE NEGATIVE  Final    Comment: (NOTE) The Xpert Xpress SARS-CoV-2/FLU/RSV plus assay is intended as an aid in the diagnosis of influenza from Nasopharyngeal swab specimens and should not be used as a sole basis for treatment. Nasal washings and aspirates are unacceptable for Xpert Xpress SARS-CoV-2/FLU/RSV testing.  Fact Sheet for Patients: EntrepreneurPulse.com.au  Fact Sheet for Healthcare Providers: IncredibleEmployment.be  This test is not yet approved or cleared by the [place] FDA and has been authorized for detection and/or diagnosis of SARS-CoV-2 by FDA under an Emergency Use Authorization (EUA). This EUA will remain in effect (meaning this test can be used) for the duration of the COVID-19 declaration under Section 564(b)(1) of the Act, 21 U.S.C. section 360bbb-3(b)(1), unless the authorization is terminated or revoked.  Performed at [facility], [address] [place], [place] [address]      Time coordinating discharge: Over 30 minutes  SIGNED:   Nolberto Hanlon, MD  [facility] 04/07/2021, 1:16 PM Pager   If 7PM-7AM, please contact  night-coverage [email or web address] Password [email or web address]

## 2021-05-02 ENCOUNTER — Ambulatory Visit: Payer: Self-pay

## 2021-05-03 ENCOUNTER — Ambulatory Visit (INDEPENDENT_AMBULATORY_CARE_PROVIDER_SITE_OTHER): Payer: Medicaid Other

## 2021-05-03 ENCOUNTER — Other Ambulatory Visit: Payer: Self-pay

## 2021-05-03 ENCOUNTER — Ambulatory Visit
Admission: RE | Admit: 2021-05-03 | Discharge: 2021-05-03 | Disposition: A | Discharge status: Home / Self Care | Payer: Medicaid Other | Source: Ambulatory Visit | Attending: Physician Assistant | Admitting: Physician Assistant

## 2021-05-03 VITALS — BP 104/45 | HR 91 | Temp 98.0°F | Resp 20 | Ht 65.0 in | Wt 300.0 lb

## 2021-05-03 DIAGNOSIS — F17200 Nicotine dependence, unspecified, uncomplicated: Secondary | ICD-10-CM | POA: Diagnosis not present

## 2021-05-03 DIAGNOSIS — B349 Viral infection, unspecified: Secondary | ICD-10-CM | POA: Insufficient documentation

## 2021-05-03 DIAGNOSIS — Z28310 Unvaccinated for covid-19: Secondary | ICD-10-CM | POA: Insufficient documentation

## 2021-05-03 DIAGNOSIS — R112 Nausea with vomiting, unspecified: Secondary | ICD-10-CM | POA: Diagnosis not present

## 2021-05-03 DIAGNOSIS — R059 Cough, unspecified: Secondary | ICD-10-CM | POA: Insufficient documentation

## 2021-05-03 DIAGNOSIS — Z20822 Contact with and (suspected) exposure to covid-19: Secondary | ICD-10-CM | POA: Insufficient documentation

## 2021-05-03 DIAGNOSIS — J45901 Unspecified asthma with (acute) exacerbation: Secondary | ICD-10-CM | POA: Diagnosis not present

## 2021-05-03 DIAGNOSIS — R0789 Other chest pain: Secondary | ICD-10-CM | POA: Diagnosis not present

## 2021-05-03 DIAGNOSIS — R0602 Shortness of breath: Secondary | ICD-10-CM | POA: Insufficient documentation

## 2021-05-03 DIAGNOSIS — R519 Headache, unspecified: Secondary | ICD-10-CM | POA: Diagnosis present

## 2021-05-03 LAB — RESP PANEL BY RT-PCR (FLU A&B, COVID) ARPGX2
Influenza A by PCR: NEGATIVE
Influenza B by PCR: NEGATIVE
SARS Coronavirus 2 by RT PCR: NEGATIVE

## 2021-05-03 LAB — GROUP A STREP BY PCR: Group A Strep by PCR: NOT DETECTED

## 2021-05-03 MED ORDER — ONDANSETRON 4 MG PO TBDP
4.0000 mg | ORAL_TABLET | Freq: Once | ORAL | Status: AC
  Administered 2021-05-03: 4 mg via ORAL

## 2021-05-03 MED ORDER — PREDNISONE 20 MG PO TABS: 40.0000 mg | ORAL_TABLET | Freq: Every day | ORAL | 0 refills | Status: AC

## 2021-05-03 MED ORDER — BENZONATATE 200 MG PO CAPS: 200.0000 mg | ORAL_CAPSULE | Freq: Three times a day (TID) | ORAL | 0 refills | Status: DC | PRN

## 2021-05-03 MED ORDER — ONDANSETRON 4 MG PO TBDP: 4.0000 mg | ORAL_TABLET | Freq: Four times a day (QID) | ORAL | 0 refills | Status: DC | PRN

## 2021-05-03 NOTE — Discharge Instructions (Addendum)
Your strep, flu and COVID test are all negative and your chest x-ray is normal.  Symptoms are consistent with a viral illness and exacerbation of your asthma.  I have sent in prednisone and benzonatate to help with cough.  Increase rest and fluids.  Continue to use your albuterol nebulizer at home but if you feel like it is not helping you may need to go to the emergency department.  For any acute or severe worsening of her symptoms, please call 911 or go to emergency department.  You should feel better within a week or so.  Follow-up for fever, worsening cough or increased pain difficulty.  Work note given.

## 2021-05-03 NOTE — ED Triage Notes (Signed)
Pt here with C/O cough, body aches, Headache, nasal congestion coughing until vomit and pass out for 3 days.

## 2021-05-03 NOTE — ED Provider Notes (Signed)
[facility]    CSN: [id number] Arrival date & time: 05/03/21  [id number]      History   Chief Complaint Chief Complaint  Patient presents with   Cough   Headache    HPI Susan PlowmanJeanice B Swanson is a 25 y.o. female presenting for fatigue, cough, congestion, headaches, and body aches x 3 days.  Patient also admits to chest tightness and increased shortness of breath.  She also admits to nausea and vomiting.  States she has been using her albuterol nebulizer every 4 hours.  She has had some wheezing.  Patient has taken over-the-counter decongestants as well.  Patient says she cannot sleep at night due to the cough.  Patient denies any sick contacts. She is unvaccinated for COVID 19.  She does have history of asthma and polysubstance abuse with multiple drug overdoses. Patient did have Fentanyl OD on 04/05/21 and was treated for aspiration pneumonia with Augmentin.  Patient has no other complaints or concerns.  HPI  Past Medical History:  Diagnosis Date   Anxiety    Asthma    Syncope     Patient Active Problem List   Diagnosis Date Noted   Aspiration pneumonia (HCC) 04/05/2021   Nicotine dependence 04/05/2021   Acute respiratory failure (HCC) 04/05/2021   History of substance abuse (HCC) 04/05/2021   Overdose, accidental or unintentional, initial encounter 04/05/2021   Hyperglycemia 04/05/2021   Alcohol intoxication (HCC) 01/09/2021   Substance induced mood disorder (HCC) 01/09/2021   Obesity, Class III, BMI 40-49.9 (morbid obesity) (HCC) 08/12/2018   History of herpes genitalis 12/25/2017   History of asthma 06/16/2017   Hx of self-harm 06/16/2017   Recurrent major depressive disorder, in full remission (HCC) 11/09/2014   Asthma 11/23/2012    Past Surgical History:  Procedure Laterality Date   CESAREAN SECTION      OB History   No obstetric history on file.      Home Medications    Prior to Admission medications   Medication Sig Start Date End Date Taking?  Authorizing Provider  albuterol (VENTOLIN HFA) 108 (90 Base) MCG/ACT inhaler Inhale 1-2 puffs into the lungs every 6 (six) hours as needed for wheezing or shortness of breath. 02/13/21 02/13/22 Yes Shirlee LatchEaves, Zamaya Rapaport B, PA-C  benzonatate (TESSALON) 200 MG capsule Take 1 capsule (200 mg total) by mouth 3 (three) times daily as needed for cough. 05/03/21  Yes Eusebio FriendlyEaves, Guneet Delpino B, PA-C  ondansetron (ZOFRAN ODT) 4 MG disintegrating tablet Take 1 tablet (4 mg total) by mouth every 6 (six) hours as needed for nausea or vomiting. 05/03/21  Yes Shirlee LatchEaves, Jeanann Balinski B, PA-C  predniSONE (DELTASONE) 20 MG tablet Take 2 tablets (40 mg total) by mouth daily for 5 days. 05/03/21 05/08/21 Yes Shirlee LatchEaves, Tracey Hermance B, PA-C  naloxone [facility](NARCAN) nasal spray 4 mg/0.1 mL Take as needed for overdose 12/09/20   Concha SeFunke, Mary E, MD  SPRINTEC 28 0.25-35 MG-MCG tablet Take 1 tablet by mouth once daily 11/13/20   Matt HolmesHampton, Carla J, PA  budesonide-formoterol [facility](SYMBICORT) 80-4.5 MCG/ACT inhaler Inhale 2 puffs into the lungs 2 (two) times daily. Patient not taking: Reported on 07/13/2020 08/09/18 08/08/20  Tommie Samsook, Jayce G, DO  norethindrone (JENCYCLA) 0.35 MG tablet Take 1 tablet by mouth daily.  05/23/19  [provider]    Family History Family History  Problem Relation Age of Onset   Osteoarthritis Mother    Depression Mother    Healthy Father     Social History Social History   Tobacco Use  Smoking status: Every Day    Packs/day: 0.50    Types: Cigarettes   Smokeless tobacco: Never  Vaping Use   Vaping Use: Never used  Substance Use Topics   Alcohol use: Yes   Drug use: Yes    Types: Marijuana    Comment: pt states she has a hx of drug addiciton; pt states she used any and everything.      Allergies   Doxycycline   Review of Systems Review of Systems  Constitutional:  Positive for diaphoresis and fatigue. Negative for chills and fever.  HENT:  Positive for congestion and rhinorrhea. Negative for ear pain, sinus pressure, sinus pain  and sore throat.   Respiratory:  Positive for cough, chest tightness, shortness of breath and wheezing.   Gastrointestinal:  Positive for nausea and vomiting. Negative for abdominal pain.  Musculoskeletal:  Positive for myalgias. Negative for arthralgias.  Skin:  Negative for rash.  Neurological:  Positive for headaches. Negative for weakness.  Hematological:  Negative for adenopathy.    Physical Exam Triage Vital Signs ED Triage Vitals  Enc Vitals Group     BP --      Pulse --      Resp --      Temp --      Temp src --      SpO2 --      Weight 05/03/21 0845 300 lb (136.1 kg)     Height 05/03/21 0845 5\' 5"  (1.651 m)     Head Circumference --      Peak Flow --      Pain Score 05/03/21 0844 9     Pain Loc --      Pain Edu? --      Excl. in [place]? --    No data found.  Updated Vital Signs BP (!) 104/45 (BP Location: Right Arm)   Pulse 91   Temp 98 F (36.7 C) (Oral)   Resp 20   Ht 5\' 5"  (1.651 m)   Wt 300 lb (136.1 kg)   LMP  (LMP Unknown)   SpO2 96%   BMI 49.92 kg/m      Physical Exam Vitals and nursing note reviewed.  Constitutional:      General: She is not in acute distress.    Appearance: Normal appearance. She is obese. She is ill-appearing and diaphoretic. She is not toxic-appearing.  HENT:     Head: Normocephalic and atraumatic.     Nose: Congestion present.     Mouth/Throat:     Mouth: Mucous membranes are moist.     Pharynx: Oropharynx is clear. Posterior oropharyngeal erythema present.     Tonsils: 1+ on the right. 1+ on the left.  Eyes:     General: No scleral icterus.       Right eye: No discharge.        Left eye: No discharge.     Conjunctiva/sclera: Conjunctivae normal.  Cardiovascular:     Rate and Rhythm: Normal rate and regular rhythm.     Heart sounds: Normal heart sounds.  Pulmonary:     Effort: Pulmonary effort is normal. No respiratory distress.     Breath sounds: Wheezing (few scattered wheezes throughout) present.  Musculoskeletal:      Cervical back: Neck supple.  Neurological:     General: No focal deficit present.     Mental Status: She is alert. Mental status is at baseline.     Motor: No weakness.     Gait:  Gait normal.  Psychiatric:        Mood and Affect: Mood normal.        Behavior: Behavior normal.        Thought Content: Thought content normal.     UC Treatments / Results  Labs (all labs ordered are listed, but only abnormal results are displayed) Labs Reviewed  RESP PANEL BY RT-PCR (FLU A&B, COVID) ARPGX2  GROUP A STREP BY PCR    EKG   Radiology DG Chest 2 View  Result Date: 05/03/2021 CLINICAL DATA:  Cough and congestion for 3 days. EXAM: CHEST - 2 VIEW COMPARISON:  04/05/2021 FINDINGS: The heart size and mediastinal contours are within normal limits. Both lungs are clear. The visualized skeletal structures are unremarkable. IMPRESSION: No active cardiopulmonary disease. Electronically Signed   By: Kennith Center M.D.   On: 05/03/2021 09:31    Procedures Procedures (including critical care time)  Medications Ordered in UC Medications  ondansetron (ZOFRAN-ODT) disintegrating tablet 4 mg (4 mg Oral Given 05/03/21 0934)    Initial Impression / Assessment and Plan / UC Course  I have reviewed the triage vital signs and the nursing notes.  Pertinent labs & imaging results that were available during my care of the patient were reviewed by me and considered in my medical decision making (see chart for details).  25 year old female with history of asthma presenting for 3-day history of fatigue, body aches, headaches, cough, congestion, chest tightness and shortness of breath.  BP currently 104/45.  Patient has been here multiple times and blood pressure is always low.  She is afebrile.  Oxygen is 96%.  Normal heart rate.  She is ill-appearing but nontoxic.  She is diaphoretic.  She does have nasal congestion on exam as well as posterior pharyngeal erythema and 1+ bilateral enlarged tonsils.   Additionally she has diffuse wheezing that is mild throughout chest.  Respiratory panel obtained to assess for influenza or COVID-19.  PCR strep test obtained.  Chest x-ray ordered.  Chest x-ray independently viewed by me.  No acute abnormality noted.  Patient given 4 mg ODT Zofran in clinic for nausea.  Reviewed results of testing with patient.  She is negative for strep, flu and COVID-19 and chest x-ray is normal.  Suspect viral illness with acute asthma exacerbation.  I have sent in prednisone, benzonatate and Zofran.  Patient request something for night.  Advised her to take NyQuil and consider melatonin.  Advised her to increase rest and fluids.  ED precautions reviewed with patient.  Work note given.  Final Clinical Impressions(s) / UC Diagnoses   Final diagnoses:  Viral illness  Asthma with acute exacerbation, unspecified asthma severity, unspecified whether persistent  Cough  Non-intractable vomiting with nausea, unspecified vomiting type     Discharge Instructions      Your strep, flu and COVID test are all negative and your chest x-ray is normal.  Symptoms are consistent with a viral illness and exacerbation of your asthma.  I have sent in prednisone and benzonatate to help with cough.  Increase rest and fluids.  Continue to use your albuterol nebulizer at home but if you feel like it is not helping you may need to go to the emergency department.  For any acute or severe worsening of her symptoms, please call 911 or go to emergency department.  You should feel better within a week or so.  Follow-up for fever, worsening cough or increased pain difficulty.  Work note given.  ED Prescriptions     Medication Sig Dispense Auth. Provider   predniSONE (DELTASONE) 20 MG tablet Take 2 tablets (40 mg total) by mouth daily for 5 days. 10 tablet Eusebio Friendly B, PA-C   benzonatate (TESSALON) 200 MG capsule Take 1 capsule (200 mg total) by mouth 3 (three) times daily as needed for  cough. 20 capsule Eusebio Friendly B, PA-C   ondansetron (ZOFRAN ODT) 4 MG disintegrating tablet Take 1 tablet (4 mg total) by mouth every 6 (six) hours as needed for nausea or vomiting. 20 tablet Gareth Morgan      PDMP not reviewed this encounter.   Shirlee Latch, PA-C 05/03/21 1023

## 2021-05-21 ENCOUNTER — Ambulatory Visit
Admission: EM | Admit: 2021-05-21 | Discharge: 2021-05-21 | Discharge status: Against Medical Advice | Payer: Medicaid Other | Attending: Emergency Medicine | Admitting: Emergency Medicine

## 2021-05-21 ENCOUNTER — Other Ambulatory Visit: Payer: Self-pay

## 2021-05-22 ENCOUNTER — Ambulatory Visit
Admission: RE | Admit: 2021-05-22 | Discharge: 2021-05-22 | Discharge status: Against Medical Advice | Payer: Medicaid Other | Source: Ambulatory Visit | Attending: Family Medicine | Admitting: Family Medicine

## 2021-09-10 ENCOUNTER — Emergency Department
Admission: EM | Admit: 2021-09-10 | Discharge: 2021-09-10 | Disposition: A | Discharge status: Home / Self Care | Payer: Medicaid Other | Attending: Emergency Medicine | Admitting: Emergency Medicine

## 2021-09-10 DIAGNOSIS — T405X1A Poisoning by cocaine, accidental (unintentional), initial encounter: Secondary | ICD-10-CM | POA: Diagnosis present

## 2021-09-10 DIAGNOSIS — T50901A Poisoning by unspecified drugs, medicaments and biological substances, accidental (unintentional), initial encounter: Secondary | ICD-10-CM

## 2021-09-10 LAB — COMPREHENSIVE METABOLIC PANEL
ALT: 23 U/L (ref 0–44)
AST: 29 U/L (ref 15–41)
Albumin: 3.7 g/dL (ref 3.5–5.0)
Alkaline Phosphatase: 71 U/L (ref 38–126)
Anion gap: 7 (ref 5–15)
BUN: 14 mg/dL (ref 6–20)
CO2: 27 mmol/L (ref 22–32)
Calcium: 8.4 mg/dL — ABNORMAL LOW (ref 8.9–10.3)
Chloride: 104 mmol/L (ref 98–111)
Creatinine, Ser: 0.78 mg/dL (ref 0.44–1.00)
GFR, Estimated: >60 mL/min (ref >60)
Glucose, Bld: 75 mg/dL (ref 70–99)
Potassium: 4.2 mmol/L (ref 3.5–5.1)
Sodium: 138 mmol/L (ref 135–145)
Total Bilirubin: 0.4 mg/dL (ref 0.3–1.2)
Total Protein: 7.2 g/dL (ref 6.5–8.1)

## 2021-09-10 LAB — CBC
HCT: 39.6 % (ref 36.0–46.0)
Hemoglobin: 12.6 g/dL (ref 12.0–15.0)
MCH: 27.3 pg (ref 26.0–34.0)
MCHC: 31.8 g/dL (ref 30.0–36.0)
MCV: 85.7 fL (ref 80.0–100.0)
Platelets: 257 10*3/uL (ref 150–400)
RBC: 4.62 MIL/uL (ref 3.87–5.11)
RDW: 13.7 % (ref 11.5–15.5)
WBC: 11.6 10*3/uL — ABNORMAL HIGH (ref 4.0–10.5)
nRBC: 0 % (ref 0.0–0.2)

## 2021-09-10 LAB — TROPONIN I (HIGH SENSITIVITY): Troponin I (High Sensitivity): 5 ng/L (ref <18)

## 2021-09-10 MED ORDER — SODIUM CHLORIDE 0.9 % IV BOLUS
500.0000 mL | Freq: Once | INTRAVENOUS | Status: AC
  Administered 2021-09-10: 500 mL via INTRAVENOUS

## 2021-09-10 NOTE — ED Provider Notes (Signed)
[facility] Provider Note    Event Date/Time   First MD Initiated Contact with Patient 09/10/21 [phone]     (approximate)   History   Accidental drug overdose   HPI  Susan Swanson is a 26 y.o. female with history of substance abuse who presents after accidental drug overdose.  Patient reports that she bought a pill from someone, she thought it was a Valium.  She was found by her husband with decreased breathing rate and difficult to arouse, was given Narcan by EMS with rapid improvement in mentation.  She is alert and oriented now.  She reports she was not trying to hurt herself.  She does report that she had a overdose on cocaine 4 months ago but is not taking opioids at this time     Physical Exam   Triage Vital Signs: ED Triage Vitals  Enc Vitals Group     BP      Pulse      Resp      Temp      Temp src      SpO2      Weight      Height      Head Circumference      Peak Flow      Pain Score      Pain Loc      Pain Edu?      Excl. in [place]?     Most recent vital signs: Vitals:   09/10/21 1030 09/10/21 1115  BP: (!) 95/48 (!) 93/56  Pulse: 79 87  Resp: 13 (!) 28  Temp:    SpO2: 100% 100%     General: Awake, no distress.  CV:  Good peripheral perfusion.  No chest wall tenderness palpation Resp:  Normal effort.  Abd:  No distention.  Other:     ED Results / Procedures / Treatments   Labs (all labs ordered are listed, but only abnormal results are displayed) Labs Reviewed  CBC - Abnormal; Notable for the following components:      Result Value   WBC 11.6 (*)    All other components within normal limits  COMPREHENSIVE METABOLIC PANEL - Abnormal; Notable for the following components:   Calcium 8.4 (*)    All other components within normal limits  TROPONIN I (HIGH SENSITIVITY)     EKG ED ECG REPORT I, Lavonia Drafts, the attending physician, personally viewed and interpreted this ECG.  Date: 09/10/2021  Rhythm: normal  sinus rhythm QRS Axis: normal Intervals: normal ST/T Wave abnormalities: normal Narrative Interpretation: no evidence of acute ischemia     RADIOLOGY    PROCEDURES:  Critical Care performed:   .1-3 Lead EKG Interpretation Performed by: Lavonia Drafts, MD Authorized by: Lavonia Drafts, MD     Interpretation: normal     ECG rate assessment: normal     Rhythm: sinus rhythm     Ectopy: none     Conduction: normal   Patient placed on the monitor for evaluation given decreased rate of breathing/overdose  MEDICATIONS ORDERED IN ED: Medications  sodium chloride 0.9 % bolus 500 mL (0 mLs Intravenous Stopped 09/10/21 1118)     IMPRESSION / MDM / ASSESSMENT AND PLAN / ED COURSE  I reviewed the triage vital signs and the nursing notes.  Patient presents after accidental drug overdose.  Found to be unresponsive with decreased rate of respiration per EMS, given Narcan with improvement.  Here she is alert and oriented and answering  questions appropriately,  She is in no distress at this time.  We will check labs, give IV fluids placed in the cardiac monitor and obtain EKG as there are reports that husband felt compelled to do compressions  Lab work today is reassuring, normal troponin, CBC unremarkable, mild nonspecific elevation of white blood cell count, CMP is normal  Patient observed in the emergency department, remains alert and well-appearing, appropriate for discharge at this time             FINAL CLINICAL IMPRESSION(S) / ED DIAGNOSES   Final diagnoses:  Accidental overdose, initial encounter     Rx / DC Orders   ED Discharge Orders     None        Note:  This document was prepared using Dragon voice recognition software and may include unintentional dictation errors.   Lavonia Drafts, MD 09/10/21 1208

## 2021-09-10 NOTE — ED Triage Notes (Signed)
4 im, 2 iv narcan , unresponsive , now axox4, pt has drug usage history , nausea, vomiting , hiusband did cpr

## 2021-10-20 ENCOUNTER — Other Ambulatory Visit: Payer: Self-pay

## 2021-10-20 ENCOUNTER — Ambulatory Visit
Admission: RE | Admit: 2021-10-20 | Discharge: 2021-10-20 | Disposition: A | Discharge status: Home / Self Care | Payer: Medicaid Other | Source: Ambulatory Visit | Attending: Emergency Medicine | Admitting: Emergency Medicine

## 2021-10-20 VITALS — BP 109/69 | HR 92 | Temp 98.3°F | Resp 14 | Ht 65.0 in | Wt 300.0 lb

## 2021-10-20 DIAGNOSIS — R519 Headache, unspecified: Secondary | ICD-10-CM | POA: Insufficient documentation

## 2021-10-20 DIAGNOSIS — Z20822 Contact with and (suspected) exposure to covid-19: Secondary | ICD-10-CM | POA: Diagnosis not present

## 2021-10-20 DIAGNOSIS — H9201 Otalgia, right ear: Secondary | ICD-10-CM | POA: Diagnosis present

## 2021-10-20 DIAGNOSIS — J020 Streptococcal pharyngitis: Secondary | ICD-10-CM | POA: Diagnosis not present

## 2021-10-20 LAB — RESP PANEL BY RT-PCR (FLU A&B, COVID) ARPGX2
Influenza A by PCR: NEGATIVE
Influenza B by PCR: NEGATIVE
SARS Coronavirus 2 by RT PCR: NEGATIVE

## 2021-10-20 LAB — GROUP A STREP BY PCR: Group A Strep by PCR: DETECTED — AB

## 2021-10-20 MED ORDER — AMOXICILLIN 500 MG PO CAPS: 500.0000 mg | ORAL_CAPSULE | Freq: Two times a day (BID) | ORAL | 0 refills | Status: AC

## 2021-10-20 MED ORDER — LIDOCAINE VISCOUS HCL 2 % MT SOLN: 15.0000 mL | OROMUCOSAL | 0 refills | Status: AC | PRN

## 2021-10-20 NOTE — ED Triage Notes (Signed)
Patient c/o sore throat, cough, and bodyaches that started yesterday.  Patient unsure of fevers.

## 2021-10-20 NOTE — ED Provider Notes (Signed)
[facility]    CSN: [id number] Arrival date & time: 10/20/21  0957      History   Chief Complaint Chief Complaint  Patient presents with   Appointment   Sore Throat   Generalized Body Aches    HPI Susan Swanson is a 26 y.o. female.   Patient presents with fever, right-sided ear pain, sore throat, nonproductive cough, intermittent wheezing and a generalized headache for 1 day.  Painful to swallow.  Decreased appetite, minimal fluid intake.  No known sick contacts.  Has not attempted treatment of symptoms.  History of asthma.  Past Medical History:  Diagnosis Date   Anxiety    Asthma    Syncope     Patient Active Problem List   Diagnosis Date Noted   Aspiration pneumonia (HCC) 04/05/2021   Nicotine dependence 04/05/2021   Acute respiratory failure (HCC) 04/05/2021   History of substance abuse (HCC) 04/05/2021   Overdose, accidental or unintentional, initial encounter 04/05/2021   Hyperglycemia 04/05/2021   Alcohol intoxication (HCC) 01/09/2021   Substance induced mood disorder (HCC) 01/09/2021   Obesity, Class III, BMI 40-49.9 (morbid obesity) (HCC) 08/12/2018   History of herpes genitalis 12/25/2017   History of asthma 06/16/2017   Hx of self-harm 06/16/2017   Recurrent major depressive disorder, in full remission (HCC) 11/09/2014   Asthma 11/23/2012    Past Surgical History:  Procedure Laterality Date   CESAREAN SECTION      OB History   No obstetric history on file.      Home Medications    Prior to Admission medications   Medication Sig Start Date End Date Taking? Authorizing Provider  albuterol (VENTOLIN HFA) 108 (90 Base) MCG/ACT inhaler Inhale 1-2 puffs into the lungs every 6 (six) hours as needed for wheezing or shortness of breath. 02/13/21 02/13/22  Shirlee Latch, PA-C  benzonatate (TESSALON) 200 MG capsule Take 1 capsule (200 mg total) by mouth 3 (three) times daily as needed for cough. 05/03/21   Shirlee Latch, PA-C  naloxone  [facility]) nasal spray 4 mg/0.1 mL Take as needed for overdose 12/09/20   Concha Se, MD  ondansetron (ZOFRAN ODT) 4 MG disintegrating tablet Take 1 tablet (4 mg total) by mouth every 6 (six) hours as needed for nausea or vomiting. 05/03/21   Shirlee Latch, PA-C  SPRINTEC 28 0.25-35 MG-MCG tablet Take 1 tablet by mouth once daily 11/13/20   Matt Holmes, PA  budesonide-formoterol [facility]) 80-4.5 MCG/ACT inhaler Inhale 2 puffs into the lungs 2 (two) times daily. Patient not taking: Reported on 07/13/2020 08/09/18 08/08/20  Tommie Sams, DO  norethindrone (JENCYCLA) 0.35 MG tablet Take 1 tablet by mouth daily.  05/23/19  [provider]    Family History Family History  Problem Relation Age of Onset   Osteoarthritis Mother    Depression Mother    Healthy Father     Social History Social History   Tobacco Use   Smoking status: Every Day    Packs/day: 0.50    Types: Cigarettes   Smokeless tobacco: Never  Vaping Use   Vaping Use: Never used  Substance Use Topics   Alcohol use: Yes   Drug use: Yes    Types: Marijuana    Comment: pt states she has a hx of drug addiciton; pt states she used any and everything.      Allergies   Doxycycline   Review of Systems Review of Systems  Constitutional:  Positive for  appetite change and fever. Negative for activity change, chills, diaphoresis, fatigue and unexpected weight change.  HENT:  Positive for ear pain and sore throat. Negative for congestion, dental problem, drooling, ear discharge, facial swelling, hearing loss, mouth sores, nosebleeds, postnasal drip, rhinorrhea, sinus pressure, sinus pain, sneezing, tinnitus, trouble swallowing and voice change.   Respiratory:  Positive for cough and wheezing. Negative for apnea, choking, chest tightness, shortness of breath and stridor.   Gastrointestinal: Negative.   Musculoskeletal:  Positive for myalgias. Negative for arthralgias, back pain, gait problem, joint swelling, neck  pain and neck stiffness.  Skin: Negative.   Neurological:  Positive for headaches. Negative for dizziness, tremors, seizures, syncope, facial asymmetry, speech difficulty, weakness, light-headedness and numbness.    Physical Exam Triage Vital Signs ED Triage Vitals  Enc Vitals Group     BP 10/20/21 1025 109/69     Pulse Rate 10/20/21 1025 92     Resp 10/20/21 1025 14     Temp 10/20/21 1025 98.3 F (36.8 C)     Temp Source 10/20/21 1025 Oral     SpO2 10/20/21 1025 100 %     Weight 10/20/21 1023 300 lb (136.1 kg)     Height 10/20/21 1023 5\' 5"  (1.651 m)     Head Circumference --      Peak Flow --      Pain Score 10/20/21 1023 10     Pain Loc --      Pain Edu? --      Excl. in GC? --    No data found.  Updated Vital Signs BP 109/69 (BP Location: Left Arm)    Pulse 92    Temp 98.3 F (36.8 C) (Oral)    Resp 14    Ht 5\' 5"  (1.651 m)    Wt 300 lb (136.1 kg)    LMP 10/13/2021 (Approximate)    SpO2 100%    BMI 49.92 kg/m   Visual Acuity Right Eye Distance:   Left Eye Distance:   Bilateral Distance:    Right Eye Near:   Left Eye Near:    Bilateral Near:     Physical Exam Constitutional:      Appearance: She is well-developed. She is ill-appearing.  HENT:     Head: Normocephalic.     Right Ear: Tympanic membrane and ear canal normal.     Left Ear: Tympanic membrane and ear canal normal.     Nose: No congestion or rhinorrhea.     Mouth/Throat:     Mouth: Mucous membranes are moist.     Pharynx: Posterior oropharyngeal erythema present.     Tonsils: No tonsillar exudate. 1+ on the right. 1+ on the left.  Cardiovascular:     Rate and Rhythm: Normal rate and regular rhythm.     Heart sounds: Normal heart sounds.  Pulmonary:     Effort: Pulmonary effort is normal.     Breath sounds: Normal breath sounds.  Musculoskeletal:     Cervical back: Normal range of motion.  Lymphadenopathy:     Cervical: Cervical adenopathy present.  Skin:    General: Skin is dry.   Neurological:     Mental Status: She is alert.  Psychiatric:        Mood and Affect: Mood normal.        Behavior: Behavior normal.     UC Treatments / Results  Labs (all labs ordered are listed, but only abnormal results are displayed) Labs Reviewed  GROUP A STREP  BY PCR  RESP PANEL BY RT-PCR (FLU A&B, COVID) ARPGX2    EKG   Radiology No results found.  Procedures Procedures (including critical care time)  Medications Ordered in UC Medications - No data to display  Initial Impression / Assessment and Plan / UC Course  I have reviewed the triage vital signs and the nursing notes.  Pertinent labs & imaging results that were available during my care of the patient were reviewed by me and considered in my medical decision making (see chart for details).  Clinical Course as of 10/20/21 1221  Sun Oct 20, 2021  1108 Resp Panel by RT-PCR (Flu A&B, Covid) Nasopharyngeal Swab [AW]    Clinical Course User Index [AW] Valinda Hoar, NP   Strep pharyngitis  Confirmed by PCR, negative for COVID and flu, discussed with patient, amoxicillin 10-day course prescribed, lidocaine viscous sent for additional comfort, may use over-the-counter Tylenol and ibuprofen for pain, may attempt use of salt water gargles, throat lozenges, warm teas and teaspoons of honey for support, may follow-up with urgent care as needed, work note given  Final Clinical Impressions(s) / UC Diagnoses   Final diagnoses:  None   Discharge Instructions   None    ED Prescriptions   None    PDMP not reviewed this encounter.   Valinda Hoar, NP 10/20/21 1222

## 2021-10-20 NOTE — Discharge Instructions (Addendum)
Your strep test today was positive, negative for COVID and flu  Take amoxicillin twice a day for the next 10 days, you should start seeing improvement in the next 24 to 48 hours  You may gargle and spit lidocaine solution every 4 hours as needed for temporary relief of your sore throat  May use over-the-counter ibuprofen or Tylenol for comfort  May attempt use of salt water gargles, throat lozenges, warm teas and teaspoons of honey for additional comfort  You may follow-up at urgent care as needed

## 2021-12-01 ENCOUNTER — Ambulatory Visit (INDEPENDENT_AMBULATORY_CARE_PROVIDER_SITE_OTHER): Payer: Medicaid Other

## 2021-12-01 ENCOUNTER — Ambulatory Visit
Admission: RE | Admit: 2021-12-01 | Discharge: 2021-12-01 | Disposition: A | Discharge status: Home / Self Care | Payer: Medicaid Other | Source: Ambulatory Visit | Attending: Physician Assistant | Admitting: Physician Assistant

## 2021-12-01 ENCOUNTER — Other Ambulatory Visit: Payer: Self-pay

## 2021-12-01 VITALS — BP 109/72 | HR 101 | Temp 98.2°F | Resp 17 | Ht 65.0 in | Wt 300.0 lb

## 2021-12-01 DIAGNOSIS — R059 Cough, unspecified: Secondary | ICD-10-CM | POA: Diagnosis not present

## 2021-12-01 DIAGNOSIS — R0989 Other specified symptoms and signs involving the circulatory and respiratory systems: Secondary | ICD-10-CM

## 2021-12-01 DIAGNOSIS — J069 Acute upper respiratory infection, unspecified: Secondary | ICD-10-CM | POA: Diagnosis not present

## 2021-12-01 MED ORDER — PROMETHAZINE-DM 6.25-15 MG/5ML PO SYRP: 5.0000 mL | ORAL_SOLUTION | Freq: Four times a day (QID) | ORAL | 0 refills | Status: DC | PRN

## 2021-12-01 MED ORDER — PROMETHAZINE-DM 6.25-15 MG/5ML PO SYRP: 5.0000 mL | ORAL_SOLUTION | Freq: Every evening | ORAL | 0 refills | Status: AC | PRN

## 2021-12-01 MED ORDER — BENZONATATE 100 MG PO CAPS: 100.0000 mg | ORAL_CAPSULE | Freq: Three times a day (TID) | ORAL | 0 refills | Status: AC

## 2021-12-01 NOTE — ED Triage Notes (Addendum)
Patient c/o cough and chest congestion that started 5 days ago.  Patient reports SOB.  Patient denies fevers.  Patient also states that she might have aspirated vomit 2 nights ago.  Patient states that she was sleeping and vomited.  ?

## 2021-12-01 NOTE — ED Provider Notes (Signed)
?[facility] ? ? ? ?CSN: [id number] ?Arrival date & time: 12/01/21  1047 ? ? ?  ? ?History   ?Chief Complaint ?Chief Complaint  ?Patient presents with  ? Cough  ? Appointment  ? ? ?HPI ?Susan Swanson is a 26 y.o. female.  ? ?She presents with fever, chills, nasal congestion, rhinorrhea, sore throat, nonproductive cough, intermittent shortness of breath, wheezing and vomiting for 5 days.  Last episode of vomiting 1 day ago.  Endorses that 2 nights ago she possibly aspirated from vomiting while sleeping, was awakened by her husband who noticed.  Has not attempted treatment of symptoms.  Decreased appetite but tolerating fluids.  History of asthma.  ? ? ?Past Medical History:  ?Diagnosis Date  ? Anxiety   ? Asthma   ? Syncope   ? ? ?Patient Active Problem List  ? Diagnosis Date Noted  ? Aspiration pneumonia (HCC) 04/05/2021  ? Nicotine dependence 04/05/2021  ? Acute respiratory failure (HCC) 04/05/2021  ? History of substance abuse (HCC) 04/05/2021  ? Overdose, accidental or unintentional, initial encounter 04/05/2021  ? Hyperglycemia 04/05/2021  ? Alcohol intoxication (HCC) 01/09/2021  ? Substance induced mood disorder (HCC) 01/09/2021  ? Obesity, Class III, BMI 40-49.9 (morbid obesity) (HCC) 08/12/2018  ? History of herpes genitalis 12/25/2017  ? History of asthma 06/16/2017  ? Hx of self-harm 06/16/2017  ? Recurrent major depressive disorder, in full remission (HCC) 11/09/2014  ? Asthma 11/23/2012  ? ? ?Past Surgical History:  ?Procedure Laterality Date  ? CESAREAN SECTION    ? ? ?OB History   ?No obstetric history on file. ?  ? ? ? ?Home Medications   ? ?Prior to Admission medications   ?Medication Sig Start Date End Date Taking? Authorizing Provider  ?lidocaine (XYLOCAINE) 2 % solution Use as directed 15 mLs in the mouth or throat every 4 (four) hours as needed for mouth pain. 10/20/21   Valinda Hoar, NP  ?naloxone [facility]) nasal spray 4 mg/0.1 mL Take as needed for overdose 12/09/20   Concha Se, MD  ?[facility] 28 0.25-35 MG-MCG tablet Take 1 tablet by mouth once daily 11/13/20   Matt Holmes, PA  ?budesonide-formoterol (SYMBICORT) 80-4.5 MCG/ACT inhaler Inhale 2 puffs into the lungs 2 (two) times daily. ?Patient not taking: Reported on 07/13/2020 08/09/18 08/08/20  Tommie Sams, DO  ?norethindrone (JENCYCLA) 0.35 MG tablet Take 1 tablet by mouth daily.  05/23/19  [provider]  ? ? ?Family History ?Family History  ?Problem Relation Age of Onset  ? Osteoarthritis Mother   ? Depression Mother   ? Healthy Father   ? ? ?Social History ?Social History  ? ?Tobacco Use  ? Smoking status: Every Day  ?  Packs/day: 0.50  ?  Types: Cigarettes  ? Smokeless tobacco: Never  ?Vaping Use  ? Vaping Use: Never used  ?Substance Use Topics  ? Alcohol use: Yes  ? Drug use: Yes  ?  Types: Marijuana  ?  Comment: pt states she has a hx of drug addiciton; pt states she used any and everything.   ? ? ? ?Allergies   ?Doxycycline ? ? ?Review of Systems ?Review of Systems  ?Constitutional:  Positive for chills and fever. Negative for activity change, appetite change, diaphoresis, fatigue and unexpected weight change.  ?HENT:  Positive for congestion, rhinorrhea and sore throat. Negative for dental problem, drooling, ear discharge, ear pain, facial swelling, hearing loss, mouth sores, nosebleeds, postnasal drip, sinus pressure, sinus pain, sneezing,  tinnitus, trouble swallowing and voice change.   ?Respiratory:  Positive for cough, shortness of breath and wheezing. Negative for apnea, choking, chest tightness and stridor.   ?Cardiovascular: Negative.   ?Gastrointestinal:  Positive for vomiting. Negative for abdominal distention, abdominal pain, anal bleeding, blood in stool, constipation, diarrhea, nausea and rectal pain.  ?Skin: Negative.   ?Neurological: Negative.   ? ? ?Physical Exam ?Triage Vital Signs ?ED Triage Vitals  ?Enc Vitals Group  ?   BP 12/01/21 1105 109/72  ?   Pulse Rate 12/01/21 1105 (!) 101  ?   Resp  12/01/21 1105 17  ?   Temp 12/01/21 1105 98.2 ?F (36.8 ?C)  ?   Temp Source 12/01/21 1105 Oral  ?   SpO2 12/01/21 1105 94 %  ?   Weight 12/01/21 1102 300 lb (136.1 kg)  ?   Height 12/01/21 1102 5\' 5"  (1.651 m)  ?   Head Circumference --   ?   Peak Flow --   ?   Pain Score 12/01/21 1102 9  ?   Pain Loc --   ?   Pain Edu? --   ?   Excl. in GC? --   ? ?No data found. ? ?Updated Vital Signs ?BP 109/72 (BP Location: Right Arm)   Pulse (!) 101   Temp 98.2 ?F (36.8 ?C) (Oral)   Resp 17   Ht 5\' 5"  (1.651 m)   Wt 300 lb (136.1 kg)   LMP 11/17/2021 (Approximate)   SpO2 94%   BMI 49.92 kg/m?  ? ?Visual Acuity ?Right Eye Distance:   ?Left Eye Distance:   ?Bilateral Distance:   ? ?Right Eye Near:   ?Left Eye Near:    ?Bilateral Near:    ? ?Physical Exam ?Constitutional:   ?   Appearance: Normal appearance.  ?HENT:  ?   Head: Normocephalic.  ?   Right Ear: Tympanic membrane, ear canal and external ear normal. There is no impacted cerumen.  ?   Left Ear: Tympanic membrane, ear canal and external ear normal.  ?   Nose: Congestion present. No rhinorrhea.  ?   Mouth/Throat:  ?   Mouth: Mucous membranes are moist.  ?   Pharynx: Posterior oropharyngeal erythema present.  ?Eyes:  ?   Extraocular Movements: Extraocular movements intact.  ?Cardiovascular:  ?   Rate and Rhythm: Normal rate and regular rhythm.  ?   Pulses: Normal pulses.  ?   Heart sounds: Normal heart sounds.  ?Pulmonary:  ?   Effort: Pulmonary effort is normal.  ?   Breath sounds: Wheezing present.  ?Musculoskeletal:  ?   Cervical back: Normal range of motion and neck supple.  ?Skin: ?   General: Skin is warm and dry.  ?Neurological:  ?   General: No focal deficit present.  ?   Mental Status: She is alert and oriented to person, place, and time. Mental status is at baseline.  ?Psychiatric:     ?   Mood and Affect: Mood normal.     ?   Behavior: Behavior normal.  ? ? ? ?UC Treatments / Results  ?Labs ?(all labs ordered are listed, but only abnormal results are  displayed) ?Labs Reviewed - No data to display ? ?EKG ? ? ?Radiology ?No results found. ? ?Procedures ?Procedures (including critical care time) ? ?Medications Ordered in UC ?Medications - No data to display ? ?Initial Impression / Assessment and Plan / UC Course  ?I have reviewed the triage vital signs  and the nursing notes. ? ?Pertinent labs & imaging results that were available during my care of the patient were reviewed by me and considered in my medical decision making (see chart for details). ? ?Viral URI with cough ? ?Vital signs are stable, O2 saturation 94% on room air mild wheezing heard to auscultation, chest x-ray negative, discussed findings with patient, patient appears to be somewhat drowsy in exam room, easily awakened with stimulation, denies any use of any medications or illicit drugs, prescribed Tessalon and a small amount of Promethazine DM as cough and vomiting are most worrisome symptoms, may use over-the-counter medications for additional comfort, may follow-up with urgent care if needed for persisting symptoms ?Final Clinical Impressions(s) / UC Diagnoses  ? ?Final diagnoses:  ?None  ? ?Discharge Instructions   ?None ?  ? ?ED Prescriptions   ?None ?  ? ?PDMP not reviewed this encounter. ?  ?Valinda Hoar, NP ?12/01/21 1228 ? ?

## 2021-12-01 NOTE — Discharge Instructions (Addendum)
Your symptoms today are most likely being caused by a virus and should steadily improve in time it can take up to 7 to 10 days before you truly start to see a turnaround however things will get better ? ?Chest x-ray was negative ? ?You may use Tessalon pill every 8 hours to help calm your coughing ? ?You may use cough syrup at bedtime for additional comfort, this medication also has a nausea medicine and will help with your vomiting , be mindful this medication will make you drowsy  ?   ?You can take Tylenol and/or Ibuprofen as needed for fever reduction and pain relief. ?  ?For cough: honey 1/2 to 1 teaspoon (you can dilute the honey in water or another fluid).  You can also use guaifenesin  for cough. You can use a humidifier for chest congestion and cough.  If you don't have a humidifier, you can sit in the bathroom with the hot shower running.    ?  ?For sore throat: try warm salt water gargles, cepacol lozenges, throat spray, warm tea or water with lemon/honey, popsicles or ice, or OTC cold relief medicine for throat discomfort. ?  ?For congestion: take a daily anti-histamine like Zyrtec, Claritin, and a oral decongestant, such as pseudoephedrine.  You can also use Flonase 1-2 sprays in each nostril daily. ?  ?It is important to stay hydrated: drink plenty of fluids (water, gatorade/powerade/pedialyte, juices, or teas) to keep your throat moisturized and help further relieve irritation/discomfort. ? ?

## 2021-12-30 DEATH — deceased
